# Patient Record
Sex: Male | Born: 1975 | Hispanic: Yes | Marital: Married | Smoking: Never smoker
Health system: Southern US, Community
[De-identification: ages and names within clinical notes are randomized; demographics above are authoritative.]

## PROBLEM LIST (undated history)

## (undated) DIAGNOSIS — I10 Essential (primary) hypertension: Secondary | ICD-10-CM

## (undated) DIAGNOSIS — S6991XA Unspecified injury of right wrist, hand and finger(s), initial encounter: Secondary | ICD-10-CM

## (undated) DIAGNOSIS — F32A Depression, unspecified: Secondary | ICD-10-CM

## (undated) DIAGNOSIS — S4991XA Unspecified injury of right shoulder and upper arm, initial encounter: Secondary | ICD-10-CM

## (undated) DIAGNOSIS — F329 Major depressive disorder, single episode, unspecified: Secondary | ICD-10-CM

## (undated) HISTORY — DX: Unspecified injury of right wrist, hand and finger(s), initial encounter: S69.91XA

## (undated) HISTORY — DX: Unspecified injury of right shoulder and upper arm, initial encounter: S49.91XA

---

## 2012-09-20 ENCOUNTER — Emergency Department (HOSPITAL_BASED_OUTPATIENT_CLINIC_OR_DEPARTMENT_OTHER)
Admission: EM | Admit: 2012-09-20 | Discharge: 2012-09-20 | Disposition: A | Discharge status: Home / Self Care | Payer: BC Managed Care – PPO | Attending: Emergency Medicine | Admitting: Emergency Medicine

## 2012-09-20 ENCOUNTER — Encounter (HOSPITAL_BASED_OUTPATIENT_CLINIC_OR_DEPARTMENT_OTHER): Payer: Self-pay | Admitting: *Deleted

## 2012-09-20 ENCOUNTER — Emergency Department (HOSPITAL_BASED_OUTPATIENT_CLINIC_OR_DEPARTMENT_OTHER): Payer: BC Managed Care – PPO

## 2012-09-20 DIAGNOSIS — M25511 Pain in right shoulder: Secondary | ICD-10-CM

## 2012-09-20 DIAGNOSIS — Y929 Unspecified place or not applicable: Secondary | ICD-10-CM | POA: Insufficient documentation

## 2012-09-20 DIAGNOSIS — S60229A Contusion of unspecified hand, initial encounter: Secondary | ICD-10-CM | POA: Insufficient documentation

## 2012-09-20 DIAGNOSIS — Z8659 Personal history of other mental and behavioral disorders: Secondary | ICD-10-CM | POA: Insufficient documentation

## 2012-09-20 DIAGNOSIS — S60221A Contusion of right hand, initial encounter: Secondary | ICD-10-CM

## 2012-09-20 DIAGNOSIS — Y990 Civilian activity done for income or pay: Secondary | ICD-10-CM | POA: Insufficient documentation

## 2012-09-20 DIAGNOSIS — Y939 Activity, unspecified: Secondary | ICD-10-CM | POA: Insufficient documentation

## 2012-09-20 DIAGNOSIS — S6990XA Unspecified injury of unspecified wrist, hand and finger(s), initial encounter: Secondary | ICD-10-CM | POA: Insufficient documentation

## 2012-09-20 DIAGNOSIS — W010XXA Fall on same level from slipping, tripping and stumbling without subsequent striking against object, initial encounter: Secondary | ICD-10-CM | POA: Insufficient documentation

## 2012-09-20 HISTORY — DX: Major depressive disorder, single episode, unspecified: F32.9

## 2012-09-20 HISTORY — DX: Depression, unspecified: F32.A

## 2012-09-20 MED ORDER — HYDROCODONE-ACETAMINOPHEN 5-325 MG PO TABS: 1.0000 | ORAL_TABLET | ORAL | Status: DC | PRN

## 2012-09-20 NOTE — ED Notes (Signed)
np at bedside

## 2012-09-20 NOTE — ED Provider Notes (Signed)
Medical screening examination/treatment/procedure(s) were performed by non-physician practitioner and as supervising physician I was immediately available for consultation/collaboration.  Geoffery Lyons, MD 09/20/12 [phone]

## 2012-09-20 NOTE — ED Provider Notes (Signed)
History     CSN: [id number]  Arrival date & time 09/20/12  1328   First MD Initiated Contact with Patient 09/20/12 1334      Chief Complaint  Patient presents with  . Hand Injury  . Shoulder Injury    (Consider location/radiation/quality/duration/timing/severity/associated sxs/prior treatment) HPI Comments: Pt states that he slipped and fell yesterday at work and now he is having pain in his right hand and shoulder  Patient is a 37 y.o. male presenting with hand injury and shoulder injury. The history is provided by the patient. A language interpreter was used.  Hand Injury Location:  Hand and shoulder Time since incident:  1 day Injury: yes   Mechanism of injury: fall   Shoulder Injury    Past Medical History  Diagnosis Date  . Depression     History reviewed. No pertinent past surgical history.  History reviewed. No pertinent family history.  History  Substance Use Topics  . Smoking status: Never Smoker   . Smokeless tobacco: Not on file  . Alcohol Use: No      Review of Systems  Constitutional: Negative.   Respiratory: Negative.   Cardiovascular: Negative.     Allergies  Review of patient's allergies indicates no known allergies.  Home Medications  No current outpatient prescriptions on file.  BP 155/104  Pulse 89  Temp(Src) 98.1 F (36.7 C) (Oral)  Resp 16  Ht 5\' 6"  (1.676 m)  SpO2 100%  Physical Exam  Nursing note and vitals reviewed. Constitutional: He is oriented to person, place, and time. He appears well-developed and well-nourished.  HENT:  Head: Normocephalic and atraumatic.  Eyes: Conjunctivae and EOM are normal.  Neck: Normal range of motion. Neck supple.  Cardiovascular: Normal rate and regular rhythm.   Pulmonary/Chest: Effort normal and breath sounds normal.  Musculoskeletal:  Generalized swelling noted to right hand in snuff box area:pt has tenderness in the shoulder without gross deformity  Neurological: He is alert and  oriented to person, place, and time. Coordination normal.  Skin: Skin is warm and dry.  Psychiatric: He has a normal mood and affect.    ED Course  Procedures (including critical care time)  Labs Reviewed - No data to display Dg Shoulder Right  09/20/2012   *RADIOLOGY REPORT*  Clinical Data: Fall at work, right hand and shoulder injury  RIGHT SHOULDER - 2+ VIEW  Comparison: Concurrently obtained radiographs of the hands  Findings: Three views of the right shoulder demonstrate no acute fracture or malalignment.  The humeral head is located with respect to the glenoid.  The acromioclavicular joint appears congruent. Normal bony mineralization.  No lytic or blastic osseous lesion. Visualized thorax is unremarkable.  IMPRESSION: Negative radiographs of the shoulder.   Original Report Authenticated By: Malachy Moan, M.D.   Dg Hand Complete Right  09/20/2012   *RADIOLOGY REPORT*  Clinical Data: Fall at work, right hand and shoulder injury  RIGHT HAND - COMPLETE 3+ VIEW  Comparison: Concurrently obtained radiographs of the right shoulder  Findings: Diffuse soft tissue swelling over the hand without evidence of acute fracture or malalignment.  No retained radiopaque foreign object.  Bony mineralization is within normal limits.  No lytic or blastic osseous lesion.  IMPRESSION: Diffuse soft tissue swelling over the dorsum of the hand without acute osseous injury.   Original Report Authenticated By: Malachy Moan, M.D.     1. Hand contusion, right, initial encounter   2. Shoulder pain, right       MDM  Pt placed in splint for comfort:given vicodin and follow up with Dr. Vivi Barrack, NP 09/20/12 [phone]

## 2012-09-20 NOTE — ED Notes (Addendum)
Pt c/o fall at work  right hand  And shoulder injury x 1 day ago

## 2012-09-22 ENCOUNTER — Emergency Department (HOSPITAL_BASED_OUTPATIENT_CLINIC_OR_DEPARTMENT_OTHER)
Admission: EM | Admit: 2012-09-22 | Discharge: 2012-09-22 | Disposition: A | Discharge status: Home / Self Care | Payer: BC Managed Care – PPO | Attending: Emergency Medicine | Admitting: Emergency Medicine

## 2012-09-22 ENCOUNTER — Encounter (HOSPITAL_BASED_OUTPATIENT_CLINIC_OR_DEPARTMENT_OTHER): Payer: Self-pay | Admitting: *Deleted

## 2012-09-22 DIAGNOSIS — F329 Major depressive disorder, single episode, unspecified: Secondary | ICD-10-CM | POA: Insufficient documentation

## 2012-09-22 DIAGNOSIS — Z87828 Personal history of other (healed) physical injury and trauma: Secondary | ICD-10-CM | POA: Insufficient documentation

## 2012-09-22 DIAGNOSIS — F3289 Other specified depressive episodes: Secondary | ICD-10-CM | POA: Insufficient documentation

## 2012-09-22 DIAGNOSIS — M25539 Pain in unspecified wrist: Secondary | ICD-10-CM | POA: Insufficient documentation

## 2012-09-22 DIAGNOSIS — Z760 Encounter for issue of repeat prescription: Secondary | ICD-10-CM | POA: Insufficient documentation

## 2012-09-22 MED ORDER — TRAMADOL HCL 50 MG PO TABS: 50.0000 mg | ORAL_TABLET | Freq: Four times a day (QID) | ORAL | Status: DC | PRN

## 2012-09-22 NOTE — ED Notes (Signed)
Seen and treated 5/28 for shoulder and arm pain states that he is out of his pain meds still  Having pain

## 2012-09-22 NOTE — ED Provider Notes (Signed)
History     CSN: [id number]  Arrival date & time 09/22/12  1214   First MD Initiated Contact with Patient 09/22/12 1224      Chief Complaint  Patient presents with  . still having pain out of meds     (Consider location/radiation/quality/duration/timing/severity/associated sxs/prior treatment) HPI Comments: 37 year old Hispanic male presents to the emergency department requesting a medication refill of his pain medicine that was given to him on May 28 after sustaining her shoulder and wrist injury to his right arm from a fall at work on May 27. He had x-rays of his shoulder and wrist at that time which did not show any acute bony abnormality. He was discharged with Vicodin, however patient states he took the last one last night, and is continuing to have pain. No new injury or trauma. He was advised to followup with Dr. Pearletha Forge, however his appointment is not until Monday. Wrist pain is not that bad as he has been wearing the splint for comfort, however his shoulder has been aching. Denies radiation, numbness or tingling down his arm.  The history is provided by the patient.    Past Medical History  Diagnosis Date  . Depression     History reviewed. No pertinent past surgical history.  History reviewed. No pertinent family history.  History  Substance Use Topics  . Smoking status: Never Smoker   . Smokeless tobacco: Not on file  . Alcohol Use: No      Review of Systems  Musculoskeletal:       Positive for right shoulder pain.  Skin: Negative for color change.  Neurological: Negative for numbness.  All other systems reviewed and are negative.    Allergies  Review of patient's allergies indicates no known allergies.  Home Medications   Current Outpatient Rx  Name  Route  Sig  Dispense  Refill  . HYDROcodone-acetaminophen (NORCO/VICODIN) 5-325 MG per tablet   Oral   Take 1 tablet by mouth every 4 (four) hours as needed for pain.   10 tablet   0   . traMADol  (ULTRAM) 50 MG tablet   Oral   Take 1 tablet (50 mg total) by mouth every 6 (six) hours as needed for pain.   15 tablet   0     BP 149/100  Pulse 83  Temp(Src) 98 F (36.7 C) (Oral)  Resp 20  SpO2 100%  Physical Exam  Constitutional: He is oriented to person, place, and time. He appears well-developed and well-nourished. No distress.  HENT:  Head: Normocephalic and atraumatic.  Mouth/Throat: Oropharynx is clear and moist.  Eyes: Conjunctivae are normal.  Neck: Normal range of motion. Neck supple.  Cardiovascular: Normal rate, regular rhythm and normal heart sounds.   Pulmonary/Chest: Effort normal and breath sounds normal.  Musculoskeletal: Normal range of motion. He exhibits no edema.  Full ROM of right shoulder. Generalized tenderness to palpation of shoulder girdle. No deformity. No overlying bruising or erythema.  Neurological: He is alert and oriented to person, place, and time. He has normal strength. No sensory deficit.  Strength upper extremity 5/5 and equal bilateral.  Skin: Skin is warm and dry. He is not diaphoretic.  Psychiatric: He has a normal mood and affect. His behavior is normal.    ED Course  Procedures (including critical care time)  Labs Reviewed - No data to display Dg Shoulder Right  09/20/2012   *RADIOLOGY REPORT*  Clinical Data: Fall at work, right hand and shoulder injury  RIGHT  SHOULDER - 2+ VIEW  Comparison: Concurrently obtained radiographs of the hands  Findings: Three views of the right shoulder demonstrate no acute fracture or malalignment.  The humeral head is located with respect to the glenoid.  The acromioclavicular joint appears congruent. Normal bony mineralization.  No lytic or blastic osseous lesion. Visualized thorax is unremarkable.  IMPRESSION: Negative radiographs of the shoulder.   Original Report Authenticated By: Malachy Moan, M.D.   Dg Hand Complete Right  09/20/2012   *RADIOLOGY REPORT*  Clinical Data: Fall at work, right  hand and shoulder injury  RIGHT HAND - COMPLETE 3+ VIEW  Comparison: Concurrently obtained radiographs of the right shoulder  Findings: Diffuse soft tissue swelling over the hand without evidence of acute fracture or malalignment.  No retained radiopaque foreign object.  Bony mineralization is within normal limits.  No lytic or blastic osseous lesion.  IMPRESSION: Diffuse soft tissue swelling over the dorsum of the hand without acute osseous injury.   Original Report Authenticated By: Malachy Moan, M.D.     1. Medication refill       MDM  37 y/o male requesting medication refill on vicodin. No new injury or trauma of shoulder from 5/28. Full ROM, no deformity, generalized tenderness. Neurovascularly intact. Rx for ultram. Explained importance of f/u with Dr. Pearletha Forge as advised prior. Patient states understanding of plan and is agreeable.     Trevor Mace, PA-C 09/22/12 1246

## 2012-09-22 NOTE — ED Provider Notes (Signed)
Medical screening examination/treatment/procedure(s) were performed by non-physician practitioner and as supervising physician I was immediately available for consultation/collaboration.   Riot Barrick, MD 09/22/12 1505 

## 2012-09-25 ENCOUNTER — Encounter: Payer: Self-pay | Admitting: Family Medicine

## 2012-09-25 ENCOUNTER — Ambulatory Visit: Payer: Self-pay | Admitting: Family Medicine

## 2012-09-25 ENCOUNTER — Ambulatory Visit (INDEPENDENT_AMBULATORY_CARE_PROVIDER_SITE_OTHER): Payer: BC Managed Care – PPO | Admitting: Family Medicine

## 2012-09-25 VITALS — BP 164/99 | HR 88 | Ht 72.0 in | Wt 265.0 lb

## 2012-09-25 DIAGNOSIS — S6991XA Unspecified injury of right wrist, hand and finger(s), initial encounter: Secondary | ICD-10-CM

## 2012-09-25 DIAGNOSIS — M25519 Pain in unspecified shoulder: Secondary | ICD-10-CM

## 2012-09-25 DIAGNOSIS — S4991XA Unspecified injury of right shoulder and upper arm, initial encounter: Secondary | ICD-10-CM

## 2012-09-25 DIAGNOSIS — M25511 Pain in right shoulder: Secondary | ICD-10-CM

## 2012-09-25 DIAGNOSIS — S59909A Unspecified injury of unspecified elbow, initial encounter: Secondary | ICD-10-CM

## 2012-09-25 DIAGNOSIS — S4980XA Other specified injuries of shoulder and upper arm, unspecified arm, initial encounter: Secondary | ICD-10-CM

## 2012-09-25 MED ORDER — HYDROCODONE-ACETAMINOPHEN 5-325 MG PO TABS: 1.0000 | ORAL_TABLET | Freq: Four times a day (QID) | ORAL | Status: DC | PRN

## 2012-09-25 NOTE — Patient Instructions (Addendum)
You have a sprain of your right shoulder. [place] hielo para 15 minutos 3-4 veces por dia. Aleve 2 tabletas dos vexes por dia con comida para dolor. tambien toma 1 tableta de Norco cada 6 horas si necesita para dolor - no conducir cuando tomar [employer]. Volver a clinica en 1 1/2 semanas para cita. [place] un aparato para tu muneca toda el dia hasta la cita en 1 1/2 semanas.

## 2012-09-26 ENCOUNTER — Encounter: Payer: Self-pay | Admitting: Family Medicine

## 2012-09-26 DIAGNOSIS — S6991XA Unspecified injury of right wrist, hand and finger(s), initial encounter: Secondary | ICD-10-CM

## 2012-09-26 DIAGNOSIS — S4991XA Unspecified injury of right shoulder and upper arm, initial encounter: Secondary | ICD-10-CM

## 2012-09-26 HISTORY — DX: Unspecified injury of right wrist, hand and finger(s), initial encounter: S69.91XA

## 2012-09-26 HISTORY — DX: Unspecified injury of right shoulder and upper arm, initial encounter: S49.91XA

## 2012-09-26 NOTE — Assessment & Plan Note (Signed)
likely contusion - reviewed radiographs which are negative for fracture.  Could have occult scaphoid fracture however - advised to wear thumb spica brace regularly for full 2 weeks then return - if still tender will repeat radiographs.

## 2012-09-26 NOTE — Assessment & Plan Note (Signed)
2/2 AC sprain - expect 2-4 weeks to completely resolve.  Icing, nsaids, norco as needed for severe pain.  F/u in 2 weeks.

## 2012-09-26 NOTE — Progress Notes (Signed)
Patient ID: Bruce Middleton, male   DOB: 20-Jun-1975, 37 y.o.   MRN: [id number]  PCP: No primary provider on file.  Subjective:   HPI: Patient is a 37 y.o. male here for right shoulder and hand injury.  Patient reports on Wednesday 5/28 while at work he went to get his keys after forgetting them - accidentally tripped over something and fell directly onto outstretched right hand and shoulder. Is right handed. Went to ED and had x-rays of shoulder and hand negative for acute abnormalities. Given a brace but he's not wearing this. Given vicodin initially - took these then tried tramadol but states this made him have diarrhea. No prior issues with right wrist/hand/shoulder.  Past Medical History  Diagnosis Date  . Depression     No current outpatient prescriptions on file prior to visit.   No current facility-administered medications on file prior to visit.    History reviewed. No pertinent past surgical history.  No Known Allergies  History   Social History  . Marital Status: Married    Spouse Name: N/A    Number of Children: N/A  . Years of Education: N/A   Occupational History  . Not on file.   Social History Main Topics  . Smoking status: Never Smoker   . Smokeless tobacco: Not on file  . Alcohol Use: No  . Drug Use: No  . Sexually Active: No   Other Topics Concern  . Not on file   Social History Narrative  . No narrative on file    Family History  Problem Relation Age of Onset  . Sudden death Neg Hx   . Hypertension Neg Hx   . Heart attack Neg Hx   . Hyperlipidemia Neg Hx   . Diabetes Neg Hx     BP 164/99  Pulse 88  Ht 6' (1.829 m)  Wt 265 lb (120.203 kg)  BMI 35.93 kg/m2  Review of Systems: See HPI above.    Objective:  Physical Exam:  Gen: NAD  R shoulder: No swelling, ecchymoses.  No gross deformity. TTP at [facility].  No biceps tendon, other shoulder TTP. FROM with pain on flexion and abduction especially past 90 degrees.  Full  IR/ER. Positive crossover adduction. Positive Hawkins, Neers. Negative Speeds, Yergasons. Strength 5/5 with empty can and resisted internal/external rotation with minimal pain. Negative apprehension. NV intact distally.  R wrist/hand: No gross deformity, swelling, bruising. TTP radial aspect of wrist, radius, snuffbox. No other TTP about wrist or hand. FROM digits and wrist. NVI distally.    Assessment & Plan:  1. Right shoulder injury - 2/2 AC sprain - expect 2-4 weeks to completely resolve.  Icing, nsaids, norco as needed for severe pain.  F/u in 2 weeks.  2. Right wrist injury - likely contusion - reviewed radiographs which are negative for fracture.  Could have occult scaphoid fracture however - advised to wear thumb spica brace regularly for full 2 weeks then return - if still tender will repeat radiographs.

## 2012-10-05 ENCOUNTER — Ambulatory Visit (INDEPENDENT_AMBULATORY_CARE_PROVIDER_SITE_OTHER): Payer: BC Managed Care – PPO | Admitting: Family Medicine

## 2012-10-05 ENCOUNTER — Encounter: Payer: Self-pay | Admitting: Family Medicine

## 2012-10-05 ENCOUNTER — Ambulatory Visit (HOSPITAL_BASED_OUTPATIENT_CLINIC_OR_DEPARTMENT_OTHER)
Admission: RE | Admit: 2012-10-05 | Discharge: 2012-10-05 | Disposition: A | Discharge status: Home / Self Care | Payer: BC Managed Care – PPO | Source: Ambulatory Visit | Attending: Family Medicine | Admitting: Family Medicine

## 2012-10-05 VITALS — BP 163/94 | HR 89 | Ht 72.0 in | Wt 275.1 lb

## 2012-10-05 DIAGNOSIS — Z5189 Encounter for other specified aftercare: Secondary | ICD-10-CM

## 2012-10-05 DIAGNOSIS — M25579 Pain in unspecified ankle and joints of unspecified foot: Secondary | ICD-10-CM

## 2012-10-05 DIAGNOSIS — M25571 Pain in right ankle and joints of right foot: Secondary | ICD-10-CM

## 2012-10-05 DIAGNOSIS — M25511 Pain in right shoulder: Secondary | ICD-10-CM

## 2012-10-05 DIAGNOSIS — S6991XD Unspecified injury of right wrist, hand and finger(s), subsequent encounter: Secondary | ICD-10-CM

## 2012-10-05 DIAGNOSIS — M79671 Pain in right foot: Secondary | ICD-10-CM

## 2012-10-05 DIAGNOSIS — M79609 Pain in unspecified limb: Secondary | ICD-10-CM

## 2012-10-05 DIAGNOSIS — M25519 Pain in unspecified shoulder: Secondary | ICD-10-CM

## 2012-10-05 DIAGNOSIS — S4991XD Unspecified injury of right shoulder and upper arm, subsequent encounter: Secondary | ICD-10-CM

## 2012-10-05 MED ORDER — HYDROCODONE-ACETAMINOPHEN 5-325 MG PO TABS: 1.0000 | ORAL_TABLET | Freq: Four times a day (QID) | ORAL | Status: DC | PRN

## 2012-10-05 NOTE — Patient Instructions (Addendum)
I would expect your right shoulder pain to resolve after 2 more weeks typically though can be up to 4 weeks. Continue icing, taking aleve 2 tabs twice a day with food. Norco only as needed if you have some of this left.  You have an ankle sprain. Ice the ankle as well for 15 minutes at a time, 3-4 times a day Try laceup ankle brace for stability when up and walking around. Start physical therapy for strengthening and balance exercises. Do home exercises on days you don't go to therapy.  Can stop using the wrist brace.  Follow up with me in 4 weeks for reevaluation.

## 2012-10-09 ENCOUNTER — Encounter: Payer: Self-pay | Admitting: Family Medicine

## 2012-10-09 DIAGNOSIS — M25571 Pain in right ankle and joints of right foot: Secondary | ICD-10-CM | POA: Insufficient documentation

## 2012-10-09 NOTE — Assessment & Plan Note (Signed)
2/2 AC sprain - expect 2 more weeks to completely resolve in most cases but can be up to 4 weeks.  Icing, nsaids, norco as needed for severe pain.  F/u in 4 weeks.

## 2012-10-09 NOTE — Assessment & Plan Note (Signed)
2/2 contusion - Radiographs were negative for fracture.  Now not having any tenderness.  Discontinue use of splint.  Will not repeat radiographs as clinically improved.

## 2012-10-09 NOTE — Assessment & Plan Note (Signed)
2/2 sprain.  Radiographs of both negative.  ASO for stability.  Start formal PT and home exercise program.  F/u in 4 weeks.

## 2012-10-09 NOTE — Progress Notes (Signed)
Patient ID: Bruce Middleton, male   DOB: January 01, 1976, 37 y.o.   MRN: [id number]  PCP: No primary provider on file.  Subjective:   HPI: Patient is a 37 y.o. male here for f/u right shoulder and hand injury.  6/2: Patient reports on Wednesday 5/28 while at work he went to get his keys after forgetting them - accidentally tripped over something and fell directly onto outstretched right hand and shoulder. Is right handed. Went to ED and had x-rays of shoulder and hand negative for acute abnormalities. Given a brace but he's not wearing this. Given vicodin initially - took these then tried tramadol but states this made him have diarrhea. No prior issues with right wrist/hand/shoulder.  6/12: Patient reports right wrist pain has resolved - gets a little pain around the thumb but not really bothering him. Left wrist brace at home. Right shoulder still bothers him - superior aspect. Popping with motions. No swelling. Has been out of work. Also now reporting right heel and ankle are bothering him - didn't really notice right after the accident but has been worsening since last visit. Pain is across front of ankle and back of right heel. Did not have any imaging done for this. Has been taking aleve, icing, taking norco.  Past Medical History  Diagnosis Date  . Depression     No current outpatient prescriptions on file prior to visit.   No current facility-administered medications on file prior to visit.    History reviewed. No pertinent past surgical history.  No Known Allergies  History   Social History  . Marital Status: Married    Spouse Name: N/A    Number of Children: N/A  . Years of Education: N/A   Occupational History  . Not on file.   Social History Main Topics  . Smoking status: Never Smoker   . Smokeless tobacco: Not on file  . Alcohol Use: No  . Drug Use: No  . Sexually Active: No   Other Topics Concern  . Not on file   Social History Narrative  . No  narrative on file    Family History  Problem Relation Age of Onset  . Sudden death Neg Hx   . Hypertension Neg Hx   . Heart attack Neg Hx   . Hyperlipidemia Neg Hx   . Diabetes Neg Hx     BP 163/94  Pulse 89  Ht 6' (1.829 m)  Wt 275 lb 2 oz (124.796 kg)  BMI 37.31 kg/m2  Review of Systems: See HPI above.    Objective:  Physical Exam:  Gen: NAD  R shoulder: No swelling, ecchymoses.  No gross deformity. Mild TTP at [facility] Joint.  No biceps tendon, other shoulder TTP. FROM. Positive crossover adduction. Mild positive Hawkins, Neers. Negative Speeds, Yergasons. Strength 5/5 with empty can and resisted internal/external rotation with minimal pain. Negative apprehension. NV intact distally.  R wrist/hand: No gross deformity, swelling, bruising. No longer with TTP over snuffbox, distal radius.  No TTP about wrist/hand. No other TTP about wrist or hand. FROM digits and wrist. NVI distally.  R ankle/foot: No gross deformity, swelling, ecchymoses. FROM with 5/5 strength all directions but reports pain with all motions. TTP posterior calcaneus with mild + calcaneal squeeze.  Pain over ATFL, anterior ankle joint as well.  No malleolar, base 5th, navicular TTP. 1+ ant drawer, neg talar tilt though this is painful. Negative syndesmotic compression. Thompsons test negative. NV intact distally.    Assessment & Plan:  1. Right shoulder injury - 2/2 AC sprain - expect 2 more weeks to completely resolve in most cases but can be up to 4 weeks.  Icing, nsaids, norco as needed for severe pain.  F/u in 4 weeks.   2. Right wrist injury - 2/2 contusion - Radiographs were negative for fracture.  Now not having any tenderness.  Discontinue use of splint.  Will not repeat radiographs as clinically improved.    3. Right heel, ankle pain - 2/2 sprain.  Radiographs of both negative.  ASO for stability.  Start formal PT and home exercise program.  F/u in 4 weeks.

## 2012-10-11 ENCOUNTER — Ambulatory Visit: Payer: BC Managed Care – PPO | Attending: Family Medicine | Admitting: Rehabilitation

## 2012-11-09 ENCOUNTER — Ambulatory Visit (INDEPENDENT_AMBULATORY_CARE_PROVIDER_SITE_OTHER): Payer: BC Managed Care – PPO | Admitting: Family Medicine

## 2012-11-09 ENCOUNTER — Encounter: Payer: Self-pay | Admitting: Family Medicine

## 2012-11-09 VITALS — BP 147/96 | HR 72 | Ht 73.0 in | Wt 265.0 lb

## 2012-11-09 DIAGNOSIS — S4991XD Unspecified injury of right shoulder and upper arm, subsequent encounter: Secondary | ICD-10-CM

## 2012-11-09 DIAGNOSIS — M25511 Pain in right shoulder: Secondary | ICD-10-CM

## 2012-11-09 DIAGNOSIS — Z5189 Encounter for other specified aftercare: Secondary | ICD-10-CM

## 2012-11-09 DIAGNOSIS — M25519 Pain in unspecified shoulder: Secondary | ICD-10-CM

## 2012-11-09 MED ORDER — HYDROCODONE-ACETAMINOPHEN 5-325 MG PO TABS: 1.0000 | ORAL_TABLET | Freq: Four times a day (QID) | ORAL | Status: DC | PRN

## 2012-11-09 MED ORDER — DICLOFENAC SODIUM 75 MG PO TBEC: 75.0000 mg | DELAYED_RELEASE_TABLET | Freq: Two times a day (BID) | ORAL | Status: DC

## 2012-11-09 NOTE — Patient Instructions (Addendum)
Check with your insurance regarding how much your deductible is and what percentage they would cover of the MRI (this is usually around 1600 dollars without insurance) - call me and let me know about this. This is the next step if you're not getting better with the conservative measures. We could add physical therapy for this but, again, your copays would be very expensive with this it sounds. Last refill of vicodin - take as needed but no driving on this. Try voltaren twice a day with food regularly.

## 2012-11-13 ENCOUNTER — Encounter: Payer: Self-pay | Admitting: Family Medicine

## 2012-11-13 NOTE — Assessment & Plan Note (Signed)
Most of his symptoms suggest AC sprain but would not expect continued pain to this point with just this.  Some evidence of rotator cuff strain as well.  Discussed typically at 6 weeks if not improving we go ahead with MRI to further assess.  Other options could include formal physical therapy and possibly subacromial injection trial (favor MRI or PT with prior injury though).  He wants to check with insurance first before pursuing PT or MRI.  Voltaren, norco for pain.  Will not refill pain medication again - patient instructed regarding this.

## 2012-11-13 NOTE — Progress Notes (Signed)
Patient ID: Bruce Middleton, male   DOB: 1975-11-27, 37 y.o.   MRN: [id number]  PCP: No primary provider on file.  Subjective:   HPI: Patient is a 37 y.o. male here for f/u right shoulder and hand injury.  6/2: Patient reports on Wednesday 5/28 while at work he went to get his keys after forgetting them - accidentally tripped over something and fell directly onto outstretched right hand and shoulder. Is right handed. Went to ED and had x-rays of shoulder and hand negative for acute abnormalities. Given a brace but he's not wearing this. Given vicodin initially - took these then tried tramadol but states this made him have diarrhea. No prior issues with right wrist/hand/shoulder.  6/12: Patient reports right wrist pain has resolved - gets a little pain around the thumb but not really bothering him. Left wrist brace at home. Right shoulder still bothers him - superior aspect. Popping with motions. No swelling. Has been out of work. Also now reporting right heel and ankle are bothering him - didn't really notice right after the accident but has been worsening since last visit. Pain is across front of ankle and back of right heel. Did not have any imaging done for this. Has been taking aleve, icing, taking norco.  7/21: Patient returns stating right shoulder is no better. Feels throbbing with weakness as well. Certain movements of shoulder including overhead, behind back cause pain. Using icy hot patches, doing home exercises on own. Feels worse picking items up also.  Past Medical History  Diagnosis Date  . Depression     No current outpatient prescriptions on file prior to visit.   No current facility-administered medications on file prior to visit.    History reviewed. No pertinent past surgical history.  No Known Allergies  History   Social History  . Marital Status: Married    Spouse Name: N/A    Number of Children: N/A  . Years of Education: N/A    Occupational History  . Not on file.   Social History Main Topics  . Smoking status: Never Smoker   . Smokeless tobacco: Not on file  . Alcohol Use: No  . Drug Use: No  . Sexually Active: No   Other Topics Concern  . Not on file   Social History Narrative  . No narrative on file    Family History  Problem Relation Age of Onset  . Sudden death Neg Hx   . Hypertension Neg Hx   . Heart attack Neg Hx   . Hyperlipidemia Neg Hx   . Diabetes Neg Hx     BP 147/96  Pulse 72  Ht 6\' 1"  (1.854 m)  Wt 265 lb (120.203 kg)  BMI 34.97 kg/m2  Review of Systems: See HPI above.    Objective:  Physical Exam:  Gen: NAD  R shoulder: No swelling, ecchymoses.  No gross deformity. Mild TTP at [facility] Joint, anterior shoulder.  No biceps tendon, other shoulder TTP. FROM with painful arc. Positive crossover adduction. Mild positive Hawkins, Neers. Negative Speeds, Yergasons. Strength 5/5 with empty can and resisted internal/external rotation with mild pain. Negative apprehension. NV intact distally.  Assessment & Plan:  1. Right shoulder injury - Most of his symptoms suggest AC sprain but would not expect continued pain to this point with just this.  Some evidence of rotator cuff strain as well.  Discussed typically at 6 weeks if not improving we go ahead with MRI to further assess.  Other options could  include formal physical therapy and possibly subacromial injection trial (favor MRI or PT with prior injury though).  He wants to check with insurance first before pursuing PT or MRI.  Voltaren, norco for pain.  Will not refill pain medication again - patient instructed regarding this.

## 2012-11-17 ENCOUNTER — Telehealth: Payer: Self-pay | Admitting: Family Medicine

## 2012-11-20 NOTE — Telephone Encounter (Signed)
See above

## 2013-10-22 ENCOUNTER — Encounter (HOSPITAL_COMMUNITY): Payer: Self-pay | Admitting: Emergency Medicine

## 2013-10-22 ENCOUNTER — Emergency Department (INDEPENDENT_AMBULATORY_CARE_PROVIDER_SITE_OTHER)
Admission: EM | Admit: 2013-10-22 | Discharge: 2013-10-22 | Disposition: A | Discharge status: Home / Self Care | Payer: No Typology Code available for payment source | Source: Home / Self Care | Attending: Emergency Medicine | Admitting: Emergency Medicine

## 2013-10-22 DIAGNOSIS — R110 Nausea: Secondary | ICD-10-CM

## 2013-10-22 DIAGNOSIS — K047 Periapical abscess without sinus: Secondary | ICD-10-CM

## 2013-10-22 DIAGNOSIS — K044 Acute apical periodontitis of pulpal origin: Secondary | ICD-10-CM

## 2013-10-22 MED ORDER — CLINDAMYCIN HCL 300 MG PO CAPS: 300.0000 mg | ORAL_CAPSULE | Freq: Four times a day (QID) | ORAL | Status: DC

## 2013-10-22 MED ORDER — GI COCKTAIL ~~LOC~~
30.0000 mL | Freq: Once | ORAL | Status: AC
  Administered 2013-10-22: 30 mL via ORAL

## 2013-10-22 MED ORDER — LIDOCAINE HCL (PF) 1 % IJ SOLN
INTRAMUSCULAR | Status: AC
  Filled 2013-10-22: qty 5

## 2013-10-22 MED ORDER — CEFTRIAXONE SODIUM 1 G IJ SOLR
1.0000 g | Freq: Once | INTRAMUSCULAR | Status: AC
  Administered 2013-10-22: 1 g via INTRAMUSCULAR

## 2013-10-22 MED ORDER — HYDROCODONE-ACETAMINOPHEN 5-325 MG PO TABS: ORAL_TABLET | ORAL | Status: DC

## 2013-10-22 MED ORDER — CEFTRIAXONE SODIUM 1 G IJ SOLR
INTRAMUSCULAR | Status: AC
  Filled 2013-10-22: qty 10

## 2013-10-22 MED ORDER — ACETAMINOPHEN 325 MG PO TABS
650.0000 mg | ORAL_TABLET | Freq: Once | ORAL | Status: AC
  Administered 2013-10-22: 650 mg via ORAL

## 2013-10-22 MED ORDER — ONDANSETRON 4 MG PO TBDP
ORAL_TABLET | ORAL | Status: AC
  Filled 2013-10-22: qty 2

## 2013-10-22 MED ORDER — ACETAMINOPHEN 325 MG PO TABS
ORAL_TABLET | ORAL | Status: AC
  Filled 2013-10-22: qty 2

## 2013-10-22 MED ORDER — GI COCKTAIL ~~LOC~~
ORAL | Status: AC
Start: 2013-10-22 — End: 2013-10-22
  Filled 2013-10-22: qty 30

## 2013-10-22 MED ORDER — ONDANSETRON 8 MG PO TBDP: 8.0000 mg | ORAL_TABLET | Freq: Three times a day (TID) | ORAL | Status: DC | PRN

## 2013-10-22 MED ORDER — ONDANSETRON 4 MG PO TBDP
8.0000 mg | ORAL_TABLET | Freq: Once | ORAL | Status: AC
  Administered 2013-10-22: 8 mg via ORAL

## 2013-10-22 MED ORDER — RANITIDINE HCL 150 MG PO TABS: 150.0000 mg | ORAL_TABLET | Freq: Two times a day (BID) | ORAL | Status: DC

## 2013-10-22 NOTE — ED Notes (Signed)
Seen [facility] and given Rx for "bad teeth" , PCN. Presents w n/v/pain face and teeth

## 2013-10-22 NOTE — Discharge Instructions (Signed)
Absceso dental  (Dental Abscess)  Un absceso dental es la acumulacin de lquido infectado (pus) debido a una infeccin bacteriana en la parte interna del diente (pulpa). Generalmente se produce en la punta de la raz del diente.  CAUSAS   Caries dentales graves.  Traumatismo dental que permite el ingreso de bacterias en la pulpa, como en el caso de un diente roto o Dresdenastillado. SNTOMAS   Dolor intenso en el interior y alrededor del diente infectado.  Hinchazn y enrojecimiento alrededor del diente, en la boca o en la cara.  Sensibilidad.  Secrecin de pus.  Mal aliento.  Gusto desagradable en la boca.  Dificultad para tragar.  Dificultad para abrir Government social research officerla boca.  Nuseas.  Vmitos.  Escalofros.  Ganglios hinchados en el cuello. DIAGNSTICO   Deben tomarle una historia clnica y dental.  El examen consistir en punzar el absceso del diente.  Le tomarn radiografas del diente para identificar el absceso. TRATAMIENTO  El objetivo del tratamiento es eliminar la infeccin. Le indicarn antibiticos para evitar que la infeccin se extienda. Para salvar el diente, el dentista podra realizarle un tratamiento de conducto. Si el diente no puede salvarse, habr que sacarlo (extraerlo) y se drenar el absceso.  INSTRUCCIONES PARA EL CUIDADO EN EL HOGAR   Slo tome medicamentos de venta libre o recetados para The PNC Financialcalmar el dolor, la fiebre, o el [place]alestar, segn las indicaciones de su mdico.  Enjuguese la boca con frecuencia (haga buches) con agua y sal ( de cucharadita de sal en 240 ml. de agua tibia) para aliviar el dolor y la inflamacin.  No conduzca mientras toma analgsicos (narcticos).  No aplique calor en la parte externa del rostro.  Regrese para completar el tratamiento con el dentista, segn las indicaciones. SOLICITE ATENCIN MDICA SI:   El dolor no se alivia con los United Parcelmedicamentos.  El dolor empeora en vez de Sheridanmejorar. SOLICITE ATENCIN MDICA DE INMEDIATO SI:    Tiene fiebre o sntomas persistentes durante ms de 2  3 das.  Tiene fiebre y los sntomas [address]empeor[address].  Siente escalofros o un dolor de cabeza muy intenso.  Tiene problemas para respirar o tragar.  Tiene dificultad para abrir Government social research officerla boca.  Observa hinchazn en el cuello o alrededor del ojo. Document Released: 04/12/2005 Document Revised: 10/12/2011 [facility] Patient Information 2015 [place], [place]. This information is not intended to replace advice given to you by your health care provider. Make sure you discuss any questions you have with your health care provider.  Blood pressure over the ideal can put you at higher risk for stroke, heart disease, and kidney failure.  For this reason, it's important to try to get your blood pressure as close as possible to the ideal.  The ideal blood pressure is 120/80.  Blood pressures from 120-139 systolic over 80-89 diastolic are labeled as "prehypertension."  This means you are at higher risk of developing hypertension in the future.  Blood pressures in this range are not treated with medication, but lifestyle changes are recommended to prevent progression to hypertension.  Blood pressures of 140 and above systolic over 90 and above diastolic are classified as hypertension and are treated with medications.  Lifestyle changes which can benefit both prehypertension and hypertension include the following:   Salt and sodium restriction.  Weight loss.  Regular exercise.  Avoidance of tobacco.  Avoidance of excess alcohol.  The "D.A.S.H" diet.   People with hypertension and prehypertension should limit their salt intake to less than 1500 mg daily.  Reading  the nutrition information on the label of many prepared foods can give you an idea of how much sodium you're consuming at each meal.  Remember that the most important number on the nutrition information is the serving size.  It may be smaller than you think.  Try to avoid adding extra salt at the  table.  You may add small amounts of salt while cooking.  Remember that salt is an acquired taste and you may get used to a using a whole lot less salt than you are using now.  Using less salt lets the food's natural flavors come through.  You might want to consider using salt substitutes, potassium chloride, pepper, or blends of herbs and spices to enhance the flavor of your food.  Foods that contain the most salt include: processed meats (like ham, bacon, lunch meat, sausage, hot dogs, and breakfast meat), chips, pretzels, salted nuts, soups, salty snacks, canned foods, junk food, fast food, restaurant food, mustard, pickles, pizza, popcorn, soy sauce, and worcestershire sauce--quite a list!  You might ask, "Is there anything I can eat?"  The answer is, "yes."  Fruits and vegetables are usually low in salt.  Fresh is better than frozen which is better than canned.  If you have canned vegetables, you can cut down on the salt content by rinsing them in tap water 3 times before cooking.     Weight loss is the second thing you can do to lower your blood pressure.  Getting to and maintaining ideal weight will often normalize your blood pressure and allow you to avoid medications, entirely, cut way down on your dosage of medications, or allow to wean off your meds.  (Note, this should only be done under the supervision of your primary care doctor.)  Of course, weight loss takes time and you may need to be on medication in the meantime.  You shoot for a body mass index of 20-25.  When you go to the urgent care or to your primary care doctor, they should calculate your BMI.  If you don't know what it is, ask.  You can calculate your BMI with the following formula:  Weight in pounds x 703/ (height in inches) x (height in inches).  There are many good diets out there: Weight Watchers and the D.A.S.H. Diet are the best, but often, just modifying a few factors can be helpful:  Don't skip meals, don't eat out, and keeping a  food diary.  I do not recommend fad diets or diet pills which often raise blood pressure.    Everyone should get regular exercise, but this is particularly important for people with high blood pressure.  Just about any exercise is good.  The only exercise which may be harmful is lifting extreme heavy weights.  I recommend moderate exercise such as walking for 30 minutes 5 days a week.  Going to the gym for a 50 minute workout 3 times a week is also good.  This amounts to 150 minutes of exercise weekly.   Anyone with high blood pressure should avoid any use of tobacco.  Tobacco use does not elevate blood pressure, but it increases the risk of heart disease and stroke.  If you are interested in quitting, discuss with your doctor how to quit.  If you are not interested in quitting, ask yourself, "What would my life be like in 10 years if I continue to smoke?"  "How will I know when it is time to quit?"  "How  would my life be better if I were to quit."   Excess alcohol intake can raise the blood pressure.  The safe alcohol intake is 2 drinks or less per day for men and 1 drink per day or less for women.   There is a very good diet which I recommend that has been designed for people with blood pressure called the D.A.S.H. Diet (dietary approaches to stop hypertension).  It consists of fruits, vegetables, lean meats, low fat dairy, whole grains, nuts and seeds.  It is very low in salt and sodium.  It has also been found to have other beneficial health effects such as lowering cholesterol and helping lose weight.  It has been developed by the [employer] Pet[employer]ational Institutes of Health and can be downloaded from the internet without any cost. Just do a Programmer, multimediaweb search on "D.A.S.H. Diet." or go the NIH website (GolfingPosters.tnwww.nih.gov).  There are also cookbooks and diet plans that can be gotten from [place] to help you with this diet.

## 2013-10-22 NOTE — ED Provider Notes (Signed)
Chief Complaint   Chief Complaint  Patient presents with  . Nausea    History of Present Illness   Bruce Middleton is a 38 year old male who's had a one-week history of pain in his left upper molar area with swelling of the gingiva. It's tender to chew on that side. He's had some headache and facial pain and facial swelling on the left. This sometimes keeps him awake at night. [facility] He's been taking this but so far isn't getting much better. Sometimes he has trouble sleeping at night because of the pain. Since he started the penicillin he's had some vomiting. He denies any fever, chills, difficulty breathing, or sore throat, or difficulty swallowing. He's had mild epigastric pain. No hematemesis or melena.  Review of Systems   Other than as noted above, the patient denies any of the following symptoms: Systemic:  No fever or chills. ENT:  No headache, ear ache, sore throat, nasal congestion, facial pain, or swelling. Lymphatic:  No adenopathy. Lungs:  No coughing or shortness of breath.  PMFSH   Past medical history, family history, social history, meds, and allergies were reviewed.   Physical Examination     Vital signs:  BP 148/93  Pulse 95  Temp(Src) 98.8 F (37.1 C) (Oral)  Resp 16  SpO2 98% General:  Alert, oriented, in no distress. ENT:  TMs and canals normal.  Nasal mucosa normal. Mouth exam:  His teeth appear intact, however the left, upper first and second molars are tender to palpation. There is minimal swelling of the gingiva. No collection of pus, no swelling of the floor the mouth, the pharynx is clear and airway is patent. Neck:  No swelling or adenopathy. Lungs:  Breath sounds clear and equal bilaterally.  No wheezes, rales or rhonchi. Heart:  Regular rhythm.  No gallops or murmers. Abdomen: Mild epigastric tenderness to palpation, no guarding or rebound. Bowel sounds are normal. Skin:  Clear, warm and dry.    [facility]   He was given Zofran ODT 8 mg sublingually, Norco 5/325 2 by mouth for pain, and Rocephin 1 g IM. He was also given a dose of GI cocktail.  Assessment   The primary encounter diagnosis was Dental infection. A diagnosis of Nausea was also pertinent to this visit.  I feel the nausea is probably due to mild gastritis.  Plan   1.  Meds:  The following meds were prescribed:   Discharge Medication List as of 10/22/2013 10:52 AM    START taking these medications   Details  clindamycin (CLEOCIN) 300 MG capsule Take 1 capsule (300 mg total) by mouth 4 (four) times daily., Starting 10/22/2013, Until Discontinued, Normal    !! HYDROcodone-acetaminophen (NORCO/VICODIN) 5-325 MG per tablet 1 to 2 tabs every 4 to 6 hours as needed for pain., Print    ondansetron (ZOFRAN ODT) 8 MG disintegrating tablet Take 1 tablet (8 mg total) by mouth every 8 (eight) hours as needed for nausea., Starting 10/22/2013, Until Discontinued, Normal    ranitidine (ZANTAC) 150 MG tablet Take 1 tablet (150 mg total) by mouth 2 (two) times daily., Starting 10/22/2013, Until Discontinued, Normal     !! - Potential duplicate medications found. Please discuss with provider.      2.  Patient Education/Counseling:  The patient was given appropriate handouts, self care instructions, and instructed in symptomatic relief. Suggested sleeping with head of bed elevated and hot salt water mouthwash.  Advised he see a dentist as soon as possible. He may stop the penicillin and switched to clindamycin instead.  3.  Follow up:  The patient was told to follow up if no better in 3 to 4 days, if becoming worse in any way, and given some red flag symptoms such as difficulty swallowing or breathing which would prompt immediate return.  Follow up with a dentist as soon as posssible.     Reuben Likesavid C Keller, MD 10/22/13 [phone]

## 2013-11-11 ENCOUNTER — Ambulatory Visit (INDEPENDENT_AMBULATORY_CARE_PROVIDER_SITE_OTHER): Payer: PRIVATE HEALTH INSURANCE | Admitting: Family Medicine

## 2013-11-11 VITALS — BP 148/88 | HR 89 | Temp 98.8°F | Resp 20 | Ht 70.25 in | Wt 273.2 lb

## 2013-11-11 DIAGNOSIS — K089 Disorder of teeth and supporting structures, unspecified: Secondary | ICD-10-CM

## 2013-11-11 DIAGNOSIS — K0889 Other specified disorders of teeth and supporting structures: Secondary | ICD-10-CM

## 2013-11-11 MED ORDER — AMOXICILLIN 875 MG PO TABS: 875.0000 mg | ORAL_TABLET | Freq: Two times a day (BID) | ORAL | Status: DC

## 2013-11-11 MED ORDER — HYDROCODONE-ACETAMINOPHEN 7.5-325 MG PO TABS: 1.0000 | ORAL_TABLET | Freq: Four times a day (QID) | ORAL | Status: DC | PRN

## 2013-11-11 NOTE — Progress Notes (Signed)
This chart was scribed for Elvina Sidle by [employer], Scribe. This patient was seen in room 2 and the patient's care was started at 12:46 AM.  @UMFCLOGO @  Patient ID: Bruce Middleton MRN: [id number], DOB: 1975/08/20, 38 y.o. Date of Encounter: 11/11/2013, 12:55 PM  Primary Physician: No PCP Per Patient  Chief Complaint: Headache  HPI: 38 y.o. year old male with history below presents with intermittent facial pain over the past week. He states that he believes the pain is originating from a molar. He states that he does not have a dentist. He denies any other pain or symptoms. He works in [employer]. He states that he has no medication allergies. He denies any history of smoking.    No past medical history on file.   Home Meds: Prior to Admission medications   Medication Sig Start Date End Date Taking? Authorizing Provider  ibuprofen (ADVIL,MOTRIN) 800 MG tablet Take 800 mg by mouth every 8 (eight) hours as needed.   Yes Historical Provider, MD  penicillin v potassium (VEETID) 500 MG tablet Take 500 mg by mouth 2 (two) times daily.   Yes Historical Provider, MD    Allergies: No Known Allergies  History   Social History  . Marital Status: Married    Spouse Name: N/A    Number of Children: N/A  . Years of Education: N/A   Occupational History  . Not on file.   Social History Main Topics  . Smoking status: Never Smoker   . Smokeless tobacco: Never Used  . Alcohol Use: No  . Drug Use: No  . Sexual Activity: Not on file   Other Topics Concern  . Not on file   Social History Narrative  . No narrative on file     Review of Systems: Constitutional: negative for chills, fever, night sweats, weight changes, or fatigue  HEENT: negative for vision changes, hearing loss, congestion, rhinorrhea, ST, epistaxis, or sinus pressure Cardiovascular: negative for chest pain or palpitations Respiratory: negative for hemoptysis, wheezing, shortness of breath, or cough Abdominal:  negative for abdominal pain, nausea, vomiting, diarrhea, or constipation Dermatological: negative for rash Neurologic: negative for headache, dizziness, or syncope All other systems reviewed and are otherwise negative with the exception to those above and in the HPI.   Physical Exam: Blood pressure 148/88, pulse 89, temperature 98.8 F (37.1 C), temperature source Oral, resp. rate 20, height 5' 10.25" (1.784 m), weight 273 lb 4 oz (123.945 kg), SpO2 99.00%., Body mass index is 38.94 kg/(m^2). General: Well developed, well nourished, in no acute distress. Head: Tenderness over tooth number 15. Normocephalic, atraumatic, eyes without discharge, sclera non-icteric, nares are without discharge. Bilateral auditory canals clear, TM's are without perforation, pearly grey and translucent with reflective cone of light bilaterally. Oral cavity moist, posterior pharynx without exudate, erythema, peritonsillar abscess, or post nasal drip. Neck: Supple. No thyromegaly. Full ROM. No lymphadenopathy. Lungs: Clear bilaterally to auscultation without wheezes, rales, or rhonchi. Breathing is unlabored. Heart: RRR with S1 S2. No murmurs, rubs, or gallops appreciated. Abdomen: Soft, non-tender, non-distended with normoactive bowel sounds. No hepatomegaly. No rebound/guarding. No obvious abdominal masses. Msk:  Strength and tone normal for age. Extremities/Skin: Warm and dry. No clubbing or cyanosis. No edema. No rashes or suspicious lesions. Neuro: Alert and oriented X 3. Moves all extremities spontaneously. Gait is normal. CNII-XII grossly in tact. Psych:  Responds to questions appropriately with a normal affect.     ASSESSMENT AND PLAN:  38 y.o. year old male with Pain,  dental - Plan: amoxicillin (AMOXIL) 875 MG tablet, HYDROcodone-acetaminophen (NORCO) 7.5-325 MG per tablet     Signed, Elvina SidleKurt Shaneka Efaw, MD 11/11/2013 12:55 PM

## 2013-11-11 NOTE — Patient Instructions (Signed)
Bruce Middleton, DDS   Caries dental  (Dental Caries) La caries dental es la ms comn de todas las enfermedades de la boca. Ocurre en todas las edades, pero es ms frecuente en nios y [place]adultos jvenes.  CMO SE DESARROLLA LA CARIES DENTAL  El proceso de caries comienza cuando las bacterias de la boca se combinan con los alimentos, (especialmente azcares y almidones) para Air cabin crewproducir placa. La placa es una sustancia que se adhiere a las superficies duras de los dientes (Engineer, structuralesmalte dental). Las bacterias de la placa producen cidos que atacan el esmalte de los dientes. Estos cidos tambin pueden atacar la superficie de la raz de un diente (cemento) si este est expuesto. Los ataques repetidos disuelven estas superficies y crean huecos en el diente (cavidades). Si no se tratan, los cidos Starbucks Corporationdestruyen las otras capas del diente.  FACTORES DE RIESGO   El consumo frecuente de bebidas azucaradas.   El consumo frecuente de alimentos que contienen azcar y almidn y de aquellos que se quedan fcilmente adheridos a los dientes.   Higiene bucal deficiente.   Sequedad en la boca.   Abuso de sustancias como metanfetaminas.   Arreglos dentales en mal estado o mal hechos.   Trastornos de Psychologist, sport and exercisela alimentacin.   Reflujo gastroesofgico (ERGE).   Ciertos tratamientos de radiacin en la cabeza y el cuello. SNTOMAS  En las etapas tempranas de la caries dental, rara vez hay sntomas. En algunos casos pueden observarse zonas blancas, con aspecto de tiza, Campbell Soupsobre el esmalte u otras capas del diente. En las etapas posteriores, los sntomas incluyen:   Hoyos y Con-wayhuecos en el esmalte.  Dolor en los dientes despus de consumir alimentos o bebidas dulces, calientes o fros.  Dolor alrededor del diente.  Inflamacin alrededor del diente. DIAGNSTICO  La mayora de las veces, la caries dental se detecta durante un control habitual. El diagnstico se realiza despus hacer de una detallada historia mdica y  odontolgica y de la observacin de las superficies de los dientes buscando signos de caries dental. En algunos casos se utilizan instrumentos especiales, como rayos lser, para buscar caries dentales. Podrn tomarle radiografas dentales de modo que puedan buscarse caries que no se observan a simple vista (como entre las zonas de BorgWarnercontacto entre dos dientes).  TRATAMIENTO  Si la caries dental se encuentra en una etapa temprana, podr revertirse con un tratamiento con flor o la aplicacin de un agente remineralizante en el consultorio del dentista. Es necesario un buen cepillado y Highspireel uso del hilo dental para ayudar a estos tratamientos. Si est en etapas ms avanzadas, el tratamiento depender de la ubicacin y la extensin de la destruccin dental:   Si se ha destruido una pequea zona del diente, la zona ser removida y las cavidades se llenarn con un material como una amalgama de oro o plata o un compuesto de resina.   Si se ha destruido una zona grande del diente, la zona destruida ser removida y se Scientific laboratory techniciancolocar una cubierta (corona) sobre la estructura que quede del diente.   Si est afectada la parte central del diente (pulpa), ser necesario realizar un procedimiento llamado tratamiento de conducto antes de llenar la cavidad o colocar una corona.   Si la mayor parte del diente est destruido, ser necesario extirpar Baristael diente (extraerlo). INSTRUCCIONES PARA EL CUIDADO EN EL HOGAR  Podr evitar, detener o revertir las caries dentales en su casa, con una buena higiene bucal. La buena higiene bucal incluye:   Una buena higiene de los dientes  al [employer] veces por da con cepillo e hilo dental.   Use una pasta dental con flor. Tambin puede usar un enjuague dental con flor si se lo recomienda el odontlogo o el mdico.   Limite la cantidad de alimentos y bebidas que contengan azcar y almidones que consume.   Evite el consumo frecuente de estos alimentos y bebidas.   Cumpla con las  visitas a un dentista para realizar controles y limpieza regulares. PREVENCIN   Mantenga una buena higiene bucal.  Considere un sellador dental. Un sellador dental es un revestimiento de material que aplica el dentista a las muescas y [employer]. El sellador impide que los alimentos queden atrapados en los huecos. Puede proteger a los Print production planner.  Pida suplementos con flor si vive en una comunidad cuya agua no tiene flor o con agua que tenga bajo contenido de flor. Use suplementos de flor segn las indicaciones del odontlogo o el mdico.  Permita las aplicaciones de flor en los dientes si se lo indica el odontlogo o el mdico. Document Released: 04/12/2005 Document Revised: 12/13/2012 [facility] Patient Information 2015 [place], [place]. This information is not intended to replace advice given to you by your health care provider. Make sure you discuss any questions you have with your health care provider.

## 2013-12-07 ENCOUNTER — Ambulatory Visit (INDEPENDENT_AMBULATORY_CARE_PROVIDER_SITE_OTHER): Payer: PRIVATE HEALTH INSURANCE | Admitting: Emergency Medicine

## 2013-12-07 ENCOUNTER — Encounter: Payer: Self-pay | Admitting: *Deleted

## 2013-12-07 VITALS — BP 140/90 | HR 79 | Temp 98.3°F | Resp 16 | Ht 72.0 in | Wt 272.0 lb

## 2013-12-07 DIAGNOSIS — S025XXA Fracture of tooth (traumatic), initial encounter for closed fracture: Secondary | ICD-10-CM

## 2013-12-07 DIAGNOSIS — S025XXB Fracture of tooth (traumatic), initial encounter for open fracture: Secondary | ICD-10-CM

## 2013-12-07 DIAGNOSIS — S139XXA Sprain of joints and ligaments of unspecified parts of neck, initial encounter: Secondary | ICD-10-CM

## 2013-12-07 MED ORDER — NAPROXEN SODIUM 550 MG PO TABS: 550.0000 mg | ORAL_TABLET | Freq: Two times a day (BID) | ORAL | Status: AC

## 2013-12-07 MED ORDER — CYCLOBENZAPRINE HCL 10 MG PO TABS
10.0000 mg | ORAL_TABLET | Freq: Three times a day (TID) | ORAL | Status: DC | PRN
Start: 2013-12-07 — End: 2015-01-05

## 2013-12-07 MED ORDER — ACETAMINOPHEN-CODEINE #3 300-30 MG PO TABS: 1.0000 | ORAL_TABLET | ORAL | Status: DC | PRN

## 2013-12-07 MED ORDER — PENICILLIN V POTASSIUM 500 MG PO TABS: 500.0000 mg | ORAL_TABLET | Freq: Four times a day (QID) | ORAL | Status: DC

## 2013-12-07 NOTE — Progress Notes (Signed)
[facility] [address], [place] [id number] [phone]  Date:  12/07/2013   Name:  Bruce Middleton   DOB:  1975-06-18   MRN:  [id number]  PCP:  No PCP Per Patient    Chief Complaint: Arm Pain and Otalgia   History of Present Illness:  Bruce Middleton is a 38 y.o. very pleasant male patient who presents with the following:  Multiple complaints.  Has recurrent dental pain in left maxillary molar.  No swelling or fever or chills. Has pain in right shoulder after lifting a heavy object.  Has pain in shoulder into the arm.  Some weakness. No direct injury Denies other complaint or health concern today.   There are no active problems to display for this patient.   History reviewed. No pertinent past medical history.  History reviewed. No pertinent past surgical history.  History  Substance Use Topics  . Smoking status: Never Smoker   . Smokeless tobacco: Never Used  . Alcohol Use: No    History reviewed. No pertinent family history.  No Known Allergies  Medication list has been reviewed and updated.  Current Outpatient Prescriptions on File Prior to Visit  Medication Sig Dispense Refill  . ibuprofen (ADVIL,MOTRIN) 800 MG tablet Take 800 mg by mouth every 8 (eight) hours as needed.      Marland Kitchen. HYDROcodone-acetaminophen (NORCO) 7.5-325 MG per tablet Take 1 tablet by mouth every 6 (six) hours as needed for moderate pain.  30 tablet  0   No current facility-administered medications on file prior to visit.    Review of Systems:  As per HPI, otherwise negative.    Physical Examination: Filed Vitals:   12/07/13 1118  BP: 140/90  Pulse: 79  Temp: 98.3 F (36.8 C)  Resp: 16   Filed Vitals:   12/07/13 1118  Height: 6' (1.829 m)  Weight: 272 lb (123.378 kg)   Body mass index is 36.88 kg/(m^2). Ideal Body Weight: Weight in (lb) to have BMI = 25: 183.9  GEN: WDWN, NAD, Non-toxic, A & O x 3 HEENT: Atraumatic, Normocephalic. Neck supple. No  masses, No LAD.  Fracture #18 Ears and Nose: No external deformity. NECK:  Tender right trapezius CV: RRR, No M/G/R. No JVD. No thrill. No extra heart sounds. PULM: CTA B, no wheezes, crackles, rhonchi. No retractions. No resp. distress. No accessory muscle use. ABD: S, NT, ND, +BS. No rebound. No HSM. EXTR: No c/c/e NEURO Normal gait.  PSYCH: Normally interactive. Conversant. Not depressed or anxious appearing.  Calm demeanor.    Assessment and Plan: Trapezius strain Anaprox Flexeril Fracture molar Pen vk  tyl #3  Signed,  Phillips OdorJeffery Anderson, MD

## 2013-12-07 NOTE — Patient Instructions (Signed)
Hipertensin (Hypertension) La hipertensin, conocida comnmente como presin arterial alta, se produce cuando la sangre bombea en las arterias con mucha fuerza. Las arterias son los vasos sanguneos que transportan la sangre desde el corazn hacia todas las partes del cuerpo. Una lectura de la presin arterial consiste en un nmero ms alto sobre un nmero ms bajo, por ejemplo, 110/72. El nmero ms alto (presin sistlica) corresponde a la presin interna de las arterias cuando el corazn [place]. El nmero ms bajo (presin diastlica) corresponde a la presin interna de las arterias cuando el corazn se relaja. En condiciones ideales, la presin arterial debe ser inferior a 120/80. La hipertensin fuerza al corazn a trabajar ms para Herbalist. Las arterias pueden estrecharse o ponerse rgidas. La hipertensin conlleva el riesgo de enfermedad cardaca, ictus y otros problemas.  [place] de riesgo de hipertensin son controlables, pero otros no lo son.  [employer] factores de riesgo que usted no puede Chief Technology Officer, se incluyen:   Manufacturing systems engineer. El riesgo es mayor para las Retail banker.  La edad. Los riesgos aumentan con la edad.  El sexo. Antes de los 45aos, los hombres corren ms [employer]. Despus de los 65aos, las mujeres corren ms [employer]. Entre los factores de riesgo que usted puede Chief Technology Officer, se incluyen:  No hacer la cantidad suficiente de actividad fsica o ejercicio.  Tener sobrepeso.  Consumir mucha grasa, azcar, caloras o sal en la dieta.  Beber alcohol en exceso. SIGNOS Y SNTOMAS Por lo general, la hipertensin no causa signos o sntomas. La hipertensin demasiado alta (crisis hipertensiva) puede causar dolor de cabeza, ansiedad, falta de aire y hemorragia nasal. DIAGNSTICO  Para detectar si usted tiene hipertensin, el mdico le medir la presin arterial mientras est sentado, con el brazo  levantado a la altura del corazn. Debe medirla al [facility] veces en el mismo brazo. Determinadas condiciones pueden causar una diferencia de presin arterial entre el brazo izquierdo y Insurance underwriter. El hecho de tener una sola lectura de la presin arterial ms alta que lo normal no significa que Stage manager. En el caso de tener una lectura de la presin arterial con un valor alto, pdale al mdico que la verifique nuevamente. [place] hipertensin arterial incluye hacer cambios en el estilo de vida y, posiblemente, tomar medicamentos. Un estilo de vida saludable puede ayudar a bajar la presin arterial alta. Quiz deba cambiar algunos hbitos. Los Levi Strauss en el estilo de vida pueden incluir:  Seguir la dieta DASH. Esta dieta tiene un alto contenido de frutas, verduras y Psychologist, prison and probation services. Incluye poca cantidad de sal, carnes rojas y azcares agregados.  Hacer al menos 2horas de actividad fsica enrgica todas las semanas.  Perder peso, si es necesario.  No fumar.  Limitar el consumo de bebidas alcohlicas.  Aprender formas de reducir el estrs. Si los cambios en el estilo de vida no son suficientes para Child psychotherapist la presin arterial, el mdico puede recetarle medicamentos. Quiz necesite tomar ms de uno. Trabaje en conjunto con su mdico para comprender los riesgos y los beneficios. INSTRUCCIONES PARA EL CUIDADO EN EL HOGAR  Haga que le midan de nuevo la presin arterial segn las indicaciones del [place] los medicamentos solamente como se lo haya indicado el mdico. Siga cuidadosamente las indicaciones. Los medicamentos para la presin arterial deben tomarse segn las indicaciones. Los medicamentos pierden eficacia al omitir las dosis. El hecho de omitir  las dosis tambin One William Carls Driveaumenta el riesgo de otros [place] Lakeproblemas.  No fume.  Contrlese la presin arterial en su casa segn las indicaciones del mdico. SOLICITE ATENCIN MDICA SI:   Piensa  que tiene una reaccin alrgica a los medicamentos.  Tiene mareos o dolores de cabeza con Naval architectrecurrencia.  Tiene hinchazn en los tobillos.  Tiene problemas de visin. SOLICITE ATENCIN MDICA DE INMEDIATO SI:  Siente un dolor de cabeza intenso o confusin.  Siente debilidad inusual, adormecimiento o que Hospital doctorse desmayar.  Siente dolor intenso en el pecho o en el abdomen.  Vomita repetidas veces.  Tiene dificultad para respirar. ASEGRESE DE QUE:   Comprende estas instrucciones.  Controlar su afeccin.  Recibir ayuda de inmediato si no mejora o si empeora. Document Released: 04/12/2005 Document Revised: 08/27/2013 [facility] Patient Information 2015 [place]ExitCare, [place]LLC. This information is not intended to replace advice given to you by your health care provider. Make sure you discuss any questions you have with your health care provider. Lesiones en los dientes (Tooth Injuries) Las 3 lesiones que con ms frecuencia pueden ocurrir en un diente son 1) fractura 2) luxacin (dislocacin), y 3) avulsin (se pierde el diente completo). Fractura. Una fractura generalmente parte el diente en 2 o ms fragmentos. Parte del diente permanece adherido a la cavidad y Neomia Dearuna o ms piezas del diente se caen.  Cuando la pulpa que se encuentra dentro del diente se daa, puede haber Cabin crewhemorragia en el lugar, o una mancha rosada o roja en la dentina. La dentina es la segunda capa amarilla que generalmente est cubierta por el esmalte. Los problemas relacionados con la dentina causan dolor debido a que la unin del esmalte con la dentina es una zona muy sensible. Para disminuir el dolor, limite la exposicin al aire, a los lquidos de la boca (saliva), a los cambios de Marketing executivetemperatura y evite rozar con la lengua la pulpa del diente.  Las fracturas pueden clasificarse en:  Fracturas de corona. Una fractura de corona implica slo al esmalte, o al esmalte y la dentina. En algunos casos las fracturas de corona se rompen de  modo simple (no involucran la pulpa) o compleja (hay ruptura de la pulpa).  Fractura de la raz. En estos casos casi siempre est involucrada la pulpa.  Una combinacin de [place]ambos. Luxacin. Luxacin significa que el diente se ha dislocado dentro de su cavidad, pero mantiene alguna unin. Hay diferentes tipos de luxaciones. Pueden identificarse porque el diente aparece:  Ms Dan Europelargo que los otros dientes (extrusin).  Est posicionado hacia adelante o hacia atrs de la fila de dientes normales (desplazado lateralmente). En cualquiera de los Cloverdalecasos, el diente debe tomarse firmemente con una mano enguantada y [place]moverse a su posicin normal. Si el procedimiento es muy difcil o doloroso, el diente debe dejarse en el lugar para que el dentista lo reposicione.   Hundido en la enca y se ve ms corto que los dems dientes (intrusin). No intente reposicionar un diente hundido. En cualquier caso de luxacin, debe concurrir a un dentista lo antes posible. Avulsin. Una avulsin elimina el diente completo de su alvolo dental. Para un mejor resultado se requiere Social workercolocar el diente en su lugar dentro de los primeros 60 minutos. Luego de 2 horas, las posibilidades de salvar el diente son pocas pero puede ser beneficioso asistir al dentista lo antes posible. Ubique y proteja el diente perdido. El diente en ocasiones estar en la boca, pero si no puede ubicarlo, verifique la ropa y la zona que lo rodea. Si  est sucio, el diente debe limpiarse suavemente con agua o agua con sal. Para hacer salina, combine  cuchara de t (1,2 mL) de sal de mesa en una taza (0,25 L) de agua tibia. El diente debe manipularse nicamente por su superficie de esmalte. Debe protegerse la raz de otras lesiones. Si el diente no puede reposicionarse en el alvolo dental luego de limpiarlo, transporte el diente en saliva, agua destilada o [employer]. Los dientes de [place] (dientes primarios) no deben reimplantarse. TRATAMIENTO    Morder suavemente una gasa o toalla ayudar a controlar el sangrado. Un nervio expuesto requiere un examen dental y cuidados dentales.  Los fragmentos de dientes deben manipularse por su superficie de esmalte, conservarse, y enviarse al consultorio con la persona lesionada.  Las fracturas menores, que involucran slo el esmalte, normalmente no requieren Health and safety inspector inmediato. Un diente tambin puede aflojarse por una lesin sin fractura o desplazamiento visible. Neomia Dear persona con una lesin de este tipo debe asistir al dentista para Surveyor, quantity, para buscar fracturas debajo de la lnea de la enca.  En las fracturas de raz se debe unir el diente fracturado a uno sano (ferulizacin dental) por un dentista lo antes posible. Si no fuera posible realizar Administrator, Civil Service, puede ser necesaria la extraccin del diente.  Solo tome medicamentos de venta libre o recetados para Chief Technology Officer, el Dentist o la fiebre, segn las indicaciones del profesional.  Si le indican antibiticos, termnelos todos, segn las indicaciones. RIESGOS Y COMPLICACIONES Las complicaciones por lesiones dentales pueden incluir:  Muerte dental.  Prdida del diente.  Deformidad cosmtica.  Infecciones. PREVENCIN Debe Chemical engineer en todos los deportes de contacto. SOLICITE ATENCIN DENTAL SI:  El Metallurgist de mejorar, o si el dolor no es controlable con los medicamentos.  Aumenta la hinchazn o el enrojecimiento en la cara cerca del diente lesionado.  La temperatura oral le sube a ms de 102 F (38.9 C) y no puede bajarla con medicamentos.  No puede abrir Government social research officer. Document Released: 07/20/2007 Document Revised: 07/05/2011 [facility] Patient Information 2015 [place], [place]. This information is not intended to replace advice given to you by your health care provider. Make sure you discuss any questions you have with your health care provider.

## 2013-12-09 ENCOUNTER — Ambulatory Visit (INDEPENDENT_AMBULATORY_CARE_PROVIDER_SITE_OTHER): Payer: PRIVATE HEALTH INSURANCE | Admitting: Physician Assistant

## 2013-12-09 VITALS — BP 168/90 | HR 83 | Temp 98.2°F | Resp 16 | Ht 71.0 in | Wt 272.6 lb

## 2013-12-09 DIAGNOSIS — K0889 Other specified disorders of teeth and supporting structures: Secondary | ICD-10-CM

## 2013-12-09 DIAGNOSIS — K089 Disorder of teeth and supporting structures, unspecified: Secondary | ICD-10-CM

## 2013-12-09 MED ORDER — HYDROCODONE-ACETAMINOPHEN 5-325 MG PO TABS: 1.0000 | ORAL_TABLET | Freq: Four times a day (QID) | ORAL | Status: DC | PRN

## 2013-12-09 NOTE — Progress Notes (Signed)
   Subjective:    Patient ID: Bruce CarrelGustavo Middleton, male    DOB: 08-09-1975, 38 y.o.   MRN: [id number]  HPI Pt presents to clinic for recheck of his tooth pain. He has been taking abx without problems.  He is not getting pain relief with the Naproxen and the Tylenol #3.  He was unable to sleep last night because of the pain.  He does not feel like the tooth has gotten better.  He has a dentist appt on 8/24.  Review of Systems  Constitutional: Negative for fever and chills.  HENT: Positive for dental problem (left lower molar).        Objective:   Physical Exam  Vitals reviewed. Constitutional: He is oriented to person, place, and time. He appears well-developed and well-nourished.  HENT:  Head: Normocephalic and atraumatic.  Right Ear: External ear normal.  Left Ear: External ear normal.  Mouth/Throat:    Eyes: Conjunctivae are normal.  Pulmonary/Chest: Effort normal.  Neurological: He is alert and oriented to person, place, and time.  Skin: Skin is warm and dry.  Psychiatric: He has a normal mood and affect. His behavior is normal. Judgment and thought content normal.       Assessment & Plan:  Pain in a tooth or teeth - Plan: HYDROcodone-acetaminophen (NORCO/VICODIN) 5-325 MG per tablet  Pt to continue NSAID and go to his dentist appt on 8/24.  We will increase his pain medication to see if he has better control of his pain.  He will continue his abx.  Benny LennertSarah Weber PA-C  [facility] 12/09/2013 1:14 PM

## 2013-12-09 NOTE — Patient Instructions (Signed)
Continue taking the antibiotic - Penicillin Continue taking the cyclobenzaprine - for the muscle spasm in your neck Continue the Naproxen for the pain in your neck and tooth pain Add the new pain medication hydrocodone for when your tooth is still hurting. Do not take addition tylenol or motrin with these medications. Eat soft foods.

## 2013-12-26 ENCOUNTER — Emergency Department (HOSPITAL_COMMUNITY)
Admission: EM | Admit: 2013-12-26 | Discharge: 2013-12-26 | Discharge status: Absent without leave | Payer: PRIVATE HEALTH INSURANCE | Source: Home / Self Care

## 2014-01-13 ENCOUNTER — Ambulatory Visit (INDEPENDENT_AMBULATORY_CARE_PROVIDER_SITE_OTHER): Payer: PRIVATE HEALTH INSURANCE | Admitting: Physician Assistant

## 2014-01-13 VITALS — BP 134/92 | HR 72 | Temp 98.3°F | Resp 18 | Ht 71.0 in | Wt 263.0 lb

## 2014-01-13 DIAGNOSIS — K089 Disorder of teeth and supporting structures, unspecified: Secondary | ICD-10-CM

## 2014-01-13 DIAGNOSIS — K0889 Other specified disorders of teeth and supporting structures: Secondary | ICD-10-CM

## 2014-01-13 DIAGNOSIS — J0100 Acute maxillary sinusitis, unspecified: Secondary | ICD-10-CM

## 2014-01-13 MED ORDER — AMOXICILLIN-POT CLAVULANATE 875-125 MG PO TABS: 1.0000 | ORAL_TABLET | Freq: Two times a day (BID) | ORAL | Status: AC

## 2014-01-13 MED ORDER — GUAIFENESIN ER 1200 MG PO TB12: 1.0000 | ORAL_TABLET | Freq: Two times a day (BID) | ORAL | Status: DC | PRN

## 2014-01-13 MED ORDER — IPRATROPIUM BROMIDE 0.03 % NA SOLN: 2.0000 | Freq: Two times a day (BID) | NASAL | Status: DC

## 2014-01-13 NOTE — Patient Instructions (Signed)
Stop the Afrin. Continuing to use it now can worsen your symptoms. Get plenty of rest and drink at least 64 ounces of water daily. See your dentist if the tooth pain and sensitivity continues.

## 2014-01-13 NOTE — Progress Notes (Signed)
Subjective:    Patient ID: Bruce Middleton, male    DOB: 1976-03-28, 38 y.o.   MRN: [id number]   PCP: No PCP Per Patient  Chief Complaint  Patient presents with  . Headache    x 2 days--around the eyes  . Dental Pain    tooth pain for 2 days  . Nasal Congestion     x 2 days--stuffy nose--sore throat--sinus pressure --a little dry cough    Medications, allergies, past medical history, surgical history, family history, social history and problem list reviewed and updated.  HPI  This 38 y.o. male presents for evaluation of 4+ days of illness.  He notes that symptoms were mild initially, but 2 days ago became more severe.  Frontal headache, mostly around his eyes, lots of nasal congestion, Afrin helps x 1-2 hours.  Sore throat, drainage, a small amount of non-productive cough.  No GI/GU symptoms, no unexplained myalgias or arthralgias. No fever/chills.  One tooth on the bottom LEFT is painful with chewing and sensitive to hot and cold food/drink.   Review of Systems As above.    Objective:   Physical Exam  Vitals reviewed. Constitutional: He is oriented to person, place, and time. Vital signs are normal. He appears well-developed and well-nourished. He is active and cooperative. No distress.  BP 134/92  Pulse 72  Temp(Src) 98.3 F (36.8 C) (Oral)  Resp 18  Ht  (1.803 m)  Wt 263 lb (119.296 kg)  BMI 36.70 kg/m2  SpO2 99%   HENT:  Head: Normocephalic and atraumatic.  Right Ear: Hearing, tympanic membrane, external ear and ear canal normal.  Left Ear: Hearing, tympanic membrane, external ear and ear canal normal.  Nose: Mucosal edema and rhinorrhea present.  No foreign bodies. Right sinus exhibits maxillary sinus tenderness and frontal sinus tenderness. Left sinus exhibits maxillary sinus tenderness and frontal sinus tenderness.  Mouth/Throat: Uvula is midline, oropharynx is clear and moist and mucous membranes are normal. No uvula swelling. No oropharyngeal  exudate.    Eyes: Conjunctivae and EOM are normal. Pupils are equal, round, and reactive to light. Right eye exhibits no discharge. Left eye exhibits no discharge. No scleral icterus.  Neck: Trachea normal, normal range of motion and full passive range of motion without pain. Neck supple. No mass and no thyromegaly present.  Cardiovascular: Normal rate, regular rhythm and normal heart sounds.   Pulmonary/Chest: Effort normal and breath sounds normal.  Lymphadenopathy:       Head (right side): No submandibular, no tonsillar, no preauricular, no posterior auricular and no occipital adenopathy present.       Head (left side): No submandibular, no tonsillar, no preauricular and no occipital adenopathy present.    He has no cervical adenopathy.       Right: No supraclavicular adenopathy present.       Left: No supraclavicular adenopathy present.  Neurological: He is alert and oriented to person, place, and time. He has normal strength. No cranial nerve deficit or sensory deficit.  Skin: Skin is warm, dry and intact. No rash noted.  Psychiatric: He has a normal mood and affect. His speech is normal and behavior is normal.          Assessment & Plan:  1. Acute maxillary sinusitis, recurrence not specified Anticipatory guidance. Supportive care. - amoxicillin-clavulanate (AUGMENTIN) 875-125 MG per tablet; Take 1 tablet by mouth 2 (two) times daily.  Dispense: 20 tablet; Refill: 0 - Guaifenesin (MUCINEX MAXIMUM STRENGTH) 1200 MG TB12; Take 1  tablet (1,200 mg total) by mouth every 12 (twelve) hours as needed.  Dispense: 14 tablet; Refill: 1 - ipratropium (ATROVENT) 0.03 % nasal spray; Place 2 sprays into both nostrils 2 (two) times daily.  Dispense: 30 mL; Refill: 0  2. Pain, dental Cover for infection with Augmentin as above.  If pain and sensitivity persist after treatment, follow-up with dentist.   Fernande Bras, PA-C Physician Assistant-Certified [facility]

## 2014-01-14 ENCOUNTER — Telehealth: Payer: Self-pay | Admitting: Physician Assistant

## 2014-01-14 MED ORDER — HYDROCODONE-ACETAMINOPHEN 5-325 MG PO TABS: 1.0000 | ORAL_TABLET | Freq: Four times a day (QID) | ORAL | Status: DC | PRN

## 2014-01-14 NOTE — Telephone Encounter (Signed)
Having lots of dental pain, and HA.  Was seen here yesterday by me. Dx with Sinusitis.  Meds ordered this encounter  Medications  . HYDROcodone-acetaminophen (NORCO) 5-325 MG per tablet    Sig: Take 1 tablet by mouth every 6 (six) hours as needed.    Dispense:  30 tablet    Refill:  0    Order Specific Question:  Supervising Provider    Answer:  DOOLITTLE, ROBERT P [[id number]]

## 2014-01-28 ENCOUNTER — Ambulatory Visit (INDEPENDENT_AMBULATORY_CARE_PROVIDER_SITE_OTHER): Payer: PRIVATE HEALTH INSURANCE

## 2014-01-28 ENCOUNTER — Ambulatory Visit (INDEPENDENT_AMBULATORY_CARE_PROVIDER_SITE_OTHER): Payer: PRIVATE HEALTH INSURANCE | Admitting: Family Medicine

## 2014-01-28 VITALS — BP 142/86 | HR 83 | Temp 97.8°F | Resp 16 | Ht 71.2 in | Wt 259.0 lb

## 2014-01-28 DIAGNOSIS — M7712 Lateral epicondylitis, left elbow: Secondary | ICD-10-CM

## 2014-01-28 DIAGNOSIS — M25522 Pain in left elbow: Secondary | ICD-10-CM

## 2014-01-28 MED ORDER — PREDNISONE 20 MG PO TABS: ORAL_TABLET | ORAL | Status: DC

## 2014-01-28 MED ORDER — IBUPROFEN 800 MG PO TABS: 800.0000 mg | ORAL_TABLET | Freq: Three times a day (TID) | ORAL | Status: DC | PRN

## 2014-01-28 MED ORDER — HYDROCODONE-ACETAMINOPHEN 7.5-325 MG PO TABS: 1.0000 | ORAL_TABLET | Freq: Four times a day (QID) | ORAL | Status: DC | PRN
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
## 2014-01-28 NOTE — Patient Instructions (Addendum)
Continue the ibuprofen 800 mg but take it 3 times daily  Take the prednisone 20 mg 3 pills daily for 2 days, then 2 daily for 2 days, then one daily for 2 days for inflammation  Use the hydrocodone pain pills only when needed for severe pain  Wear a elbow band  Try to minimize use of the left arm when working. If you decide that you are willing to take some time off from work over the next week or to contact us and we will authorize a week off to continue resting the elbow.  Referral is being made to a sports medicine physician to further assess and treat the elbow.  Return here if problems persist  Codo de Bulgariatenista Scientist, product/process development(Tennis Elbow) El profesional que lo asiste le ha diagnosticado una enfermedad denominada "codo de Bulgariatenista". Esto es el resultado de pequeos desgarros o un resentimiento (inflamacin) al comienzo (origen) del msculo extensor del Product managerantebrazo. Aunque la enfermedad a menudo se denomina codo de Bulgariatenista o de Rainiergolfista, est ocasionada por una accin repetida realizada con el codo. INSTRUCCIONES PARA EL CUIDADO DOMICILIARIO  Si la enfermedad recin aparece, el descanso ser el nico tratamiento requerido. Utilizar el otro brazo para Printmakerrealizar la tarea podr ser de Bigelow Cornersutilidad. Incluso cambiar el agarre puede ayudar a descansar la extremidad. Esto tambin podr prevenir que la enfermedad sea recurrente.  Los problemas de [place] Edwardlargo plazo, sin embargo, podrn aliviarse ms rpido mediante:  La utilizacin de Psychiatristagentes antiinflamatorios.  La aplicacin de bolsas con hielo por 30 minutos luego del da laboral, a la hora de dormir, o cuando se han finalizado las Boissevainactividades.  El mdico tambin podr recomendarle una tablilla o cabestrillo. Esto permitir que el tendn inflamado se cure. A veces, se requerirn inyecciones de esteroides junto con anestesia local y un entablillado por 1 a 2 semanas. Dos o tres inyecciones de esteroides a menudo resuelven el problema. En algunos casos de FirstEnergy Corplargo plazo, el  tendn inflamado no responde a la terapia conservadora (no Barbadosquirrgica). Otras veces puede ser necesaria la Azerbaijanciruga. EST SEGURO QUE:   Comprende las instrucciones para el alta mdica.  Controlar su enfermedad.  Solicitar atencin mdica de inmediato segn las indicaciones. Document Released: 04/12/2005 Document Revised: 07/05/2011 [facility] Patient Information 2015 [place]ExitCare, [place]LL[place]. This information is not intended to replace advice given to you by your health care provider. Make sure you discuss any questions you have with your health care provider.

## 2014-01-28 NOTE — Progress Notes (Deleted)
   Subjective:    Patient ID: Bruce Middleton, male    DOB: [birth year], 38 y.o.   MRN: [id number]  HPI    Review of Systems     Objective:   Physical Exam        Assessment & Plan:
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
## 2014-03-15 ENCOUNTER — Ambulatory Visit (INDEPENDENT_AMBULATORY_CARE_PROVIDER_SITE_OTHER): Payer: PRIVATE HEALTH INSURANCE | Admitting: Family Medicine

## 2014-03-15 ENCOUNTER — Encounter: Payer: Self-pay | Admitting: Family Medicine

## 2014-03-15 VITALS — BP 122/74 | HR 82 | Temp 98.2°F | Resp 17 | Ht 70.0 in | Wt 254.0 lb

## 2014-03-15 DIAGNOSIS — T2220XA Burn of second degree of shoulder and upper limb, except wrist and hand, unspecified site, initial encounter: Secondary | ICD-10-CM

## 2014-03-15 DIAGNOSIS — K088 Other specified disorders of teeth and supporting structures: Secondary | ICD-10-CM

## 2014-03-15 DIAGNOSIS — K0889 Other specified disorders of teeth and supporting structures: Secondary | ICD-10-CM

## 2014-03-15 DIAGNOSIS — S40022A Contusion of left upper arm, initial encounter: Secondary | ICD-10-CM

## 2014-03-15 DIAGNOSIS — M7712 Lateral epicondylitis, left elbow: Secondary | ICD-10-CM

## 2014-03-15 MED ORDER — HYDROCODONE-ACETAMINOPHEN 5-325 MG PO TABS: 1.0000 | ORAL_TABLET | Freq: Four times a day (QID) | ORAL | Status: DC | PRN

## 2014-03-15 MED ORDER — PENICILLIN V POTASSIUM 500 MG PO TABS: 500.0000 mg | ORAL_TABLET | Freq: Four times a day (QID) | ORAL | Status: DC
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
## 2014-03-16 ENCOUNTER — Telehealth: Payer: Self-pay | Admitting: Family Medicine

## 2014-03-16 ENCOUNTER — Other Ambulatory Visit: Payer: Self-pay | Admitting: Family Medicine

## 2014-03-16 DIAGNOSIS — K0889 Other specified disorders of teeth and supporting structures: Secondary | ICD-10-CM

## 2014-03-16 MED ORDER — TRAMADOL HCL 50 MG PO TABS: 50.0000 mg | ORAL_TABLET | Freq: Three times a day (TID) | ORAL | Status: DC | PRN

## 2014-03-16 NOTE — Telephone Encounter (Signed)
Patient's daughter called and states that her father is not taking well to the hydrocodone that was prescribed for him. His stomach is burning and he is nauseous. Can he have Tramadol instead?  [phone]

## 2014-03-16 NOTE — Progress Notes (Signed)
Not tolerating his medicine I gave him last night which was hydrocodone. He has tolerated tramadol in the past.l prescribe tramadol.

## 2014-03-16 NOTE — Telephone Encounter (Signed)
This was taken care of already.  

## 2014-03-24 ENCOUNTER — Telehealth: Payer: Self-pay

## 2014-03-24 NOTE — Telephone Encounter (Signed)
Patient called requesting a refill on "Tramadol" . Patient stated he is still having a lot of pain in his arm. He stated he has an appointment with an orthopaedic but its not till December 10th. Patient requesting enough medicine until he can see the Orthopaedic doctor. Patient was given a qty of #15 on 03/16/14 but states his pain is so bad he has had to take 2 to 3 tabs a day. Patients call back number is [phone]. -speaks very little english -

## 2014-03-25 NOTE — Telephone Encounter (Signed)
Call patient back:  Refill for Tramadol 50 mg  Give #30, Take one every 6-8 hours if needed.  Come in at any time if necessary for recheck, but plan to follow with ortho as scheduled.

## 2014-03-25 NOTE — Telephone Encounter (Signed)
Refill called into pharmacy. Pt advised.

## 2014-04-02 ENCOUNTER — Other Ambulatory Visit: Payer: Self-pay

## 2014-04-02 DIAGNOSIS — K0889 Other specified disorders of teeth and supporting structures: Secondary | ICD-10-CM

## 2014-04-02 NOTE — Telephone Encounter (Signed)
Pharm reqs RF of tramadol. Dr Alwyn RenHopper, per prev ph message pt has appt w/ortho 04/04/14. You had Rxd #30 on 11/29 w/sig to take 1 Q 6-8 hrs prn. If pt took 4 a day, he would be out now. Please advise. Pended.

## 2014-04-15 ENCOUNTER — Telehealth: Payer: Self-pay | Admitting: Physician Assistant

## 2014-04-15 ENCOUNTER — Other Ambulatory Visit: Payer: Self-pay | Admitting: Family Medicine

## 2014-04-15 NOTE — Telephone Encounter (Signed)
"  Our records indicate that this patient has received opioid therapy for pain management from at least 4 prescribers and at least 4 pharmacies within the last 6 months."  The document sent includes non-opioid medications as well.  Opioid prescriptions are for 1-13 day supplies of Tramadol, hydrocodone-APAP, APAP-COD #3 and oxycodone-APAP from 10/06/2013-03/25/2014.  [place] Controlled Substance Database reviewed. 04/06/2014 Tramadol 50 mg Dr. Ave Filterhandler, [facility], #60 (20 day supply) 11/07/2013 hydrocodone/APAP 5/325 Dr. Boris LownKiorala, #21 (7 day supply)  Chart reviewed. No PCP identified. Patient seen for a variety of acute illness/injury. Patient seen here: 11/21, 11/20, 10/05, 9/20, 8/16, 8/14, 7/19  While it does not appear that this patient has a opioid dependence, he is seen regularly for dental pain and other acute musculoskeletal injuries.  His next visit should include a more detailed exploration into chronic dental issues and any other chronic issue that may warrant more evaluation, treatment and/or follow-up.

## 2014-04-16 NOTE — Telephone Encounter (Signed)
Patient called requesting a refill on pain medication for tramadol. He was advised to RTC for further evaluation.

## 2014-04-18 NOTE — Telephone Encounter (Signed)
Call: Must be seen and rechecked to discuss need for further pain medicine.  Can take otc tyelenol or ibuprofen for milder pain.

## 2014-04-18 NOTE — Telephone Encounter (Signed)
LM for pt to RTC- unable to refill medication unless pt comes to office.

## 2014-06-21 ENCOUNTER — Emergency Department (INDEPENDENT_AMBULATORY_CARE_PROVIDER_SITE_OTHER)
Admission: EM | Admit: 2014-06-21 | Discharge: 2014-06-21 | Disposition: A | Discharge status: Home / Self Care | Payer: Managed Care, Other (non HMO) | Source: Home / Self Care | Attending: Family Medicine | Admitting: Family Medicine

## 2014-06-21 ENCOUNTER — Encounter (HOSPITAL_COMMUNITY): Payer: Self-pay | Admitting: Emergency Medicine

## 2014-06-21 DIAGNOSIS — K029 Dental caries, unspecified: Secondary | ICD-10-CM

## 2014-06-21 MED ORDER — CLINDAMYCIN HCL 300 MG PO CAPS: 300.0000 mg | ORAL_CAPSULE | Freq: Three times a day (TID) | ORAL | Status: DC

## 2014-06-21 MED ORDER — DICLOFENAC POTASSIUM 50 MG PO TABS: 50.0000 mg | ORAL_TABLET | Freq: Three times a day (TID) | ORAL | Status: DC

## 2014-06-21 NOTE — Discharge Instructions (Signed)
Take medicine as prescribed and see dentist when possible.

## 2014-06-21 NOTE — ED Notes (Signed)
C/o dental pain on left upper side onset 2 weeks Sx also include left side HA and chills Was put on Penicillin 2 weeks ago for dental inf; has appt w/dentist on 07/27/14 to have tooth extracted Alert, no signs of acute distress.

## 2014-06-21 NOTE — ED Provider Notes (Signed)
CSN: [id number]     Arrival date & time 06/21/14  0865 History   First MD Initiated Contact with Patient 06/21/14 0845     Chief Complaint  Patient presents with  . Dental Pain  . Headache   (Consider location/radiation/quality/duration/timing/severity/associated sxs/prior Treatment) Patient is a 39 y.o. male presenting with tooth pain and headaches. The history is provided by the patient.  Dental Pain Location:  Upper Upper teeth location:  15/LU 2nd molar Quality:  Throbbing Severity:  Moderate Progression:  Worsening Chronicity:  New Context: abscess and dental caries   Context comment:  Has seen dentist and put on pcnwith f/u extraction planned. Associated symptoms: headaches   Headache   Past Medical History  Diagnosis Date  . Depression   . Right wrist injury 09/26/2012  . Right shoulder injury 09/26/2012   History reviewed. No pertinent past surgical history. Family History  Problem Relation Age of Onset  . Sudden death Neg Hx   . Hypertension Neg Hx   . Heart attack Neg Hx   . Hyperlipidemia Neg Hx   . Diabetes Neg Hx    History  Substance Use Topics  . Smoking status: Never Smoker   . Smokeless tobacco: Never Used  . Alcohol Use: No    Review of Systems  Constitutional: Negative.   HENT: Positive for dental problem.   Neurological: Positive for headaches.    Allergies  Review of patient's allergies indicates no known allergies.  Home Medications   Prior to Admission medications   Medication Sig Start Date End Date Taking? Authorizing Provider  traMADol (ULTRAM) 50 MG tablet Take 1 tablet (50 mg total) by mouth every 8 (eight) hours as needed. 03/16/14  Yes Peyton Najjar, MD  acetaminophen-codeine (TYLENOL #3) 300-30 MG per tablet Take 1-2 tablets by mouth every 4 (four) hours as needed. 12/07/13   Carmelina Dane, MD  clindamycin (CLEOCIN) 300 MG capsule Take 1 capsule (300 mg total) by mouth 3 (three) times daily. 06/21/14   Linna Hoff, MD    cyclobenzaprine (FLEXERIL) 10 MG tablet Take 1 tablet (10 mg total) by mouth 3 (three) times daily as needed for muscle spasms. 12/07/13   Carmelina Dane, MD  diclofenac (CATAFLAM) 50 MG tablet Take 1 tablet (50 mg total) by mouth 3 (three) times daily. 06/21/14   Linna Hoff, MD  HYDROcodone-acetaminophen (NORCO/VICODIN) 5-325 MG per tablet Take 1 tablet by mouth every 6 (six) hours as needed for moderate pain. 03/15/14   Peyton Najjar, MD  naproxen sodium (ANAPROX DS) 550 MG tablet Take 1 tablet (550 mg total) by mouth 2 (two) times daily with a meal. 12/07/13 12/07/14  Carmelina Dane, MD  penicillin v potassium (VEETID) 500 MG tablet Take 1 tablet (500 mg total) by mouth 4 (four) times daily. 03/15/14   Peyton Najjar, MD   BP 164/83 mmHg  Pulse 83  Temp(Src) 97.9 F (36.6 C) (Oral)  Resp 18  SpO2 99% Physical Exam  Constitutional: He is oriented to person, place, and time. He appears well-developed and well-nourished.  HENT:  Mouth/Throat:    Neck: Normal range of motion. Neck supple.  Lymphadenopathy:    He has no cervical adenopathy.  Neurological: He is alert and oriented to person, place, and time.  Skin: Skin is warm and dry.  Nursing note and vitals reviewed.   ED Course  Procedures (including critical care time) Labs Review Labs Reviewed - No data to display  Imaging Review No  results found.   MDM   1. Pain due to dental caries        Linna HoffJames D Welma Mccombs, MD 06/21/14 [id number]

## 2015-01-05 ENCOUNTER — Ambulatory Visit (INDEPENDENT_AMBULATORY_CARE_PROVIDER_SITE_OTHER): Payer: Managed Care, Other (non HMO) | Admitting: Family Medicine

## 2015-01-05 VITALS — BP 134/87 | HR 80 | Temp 98.0°F | Resp 16 | Ht 69.0 in | Wt 265.0 lb

## 2015-01-05 DIAGNOSIS — K0889 Other specified disorders of teeth and supporting structures: Secondary | ICD-10-CM

## 2015-01-05 DIAGNOSIS — K088 Other specified disorders of teeth and supporting structures: Secondary | ICD-10-CM

## 2015-01-05 MED ORDER — PENICILLIN V POTASSIUM 500 MG PO TABS: 500.0000 mg | ORAL_TABLET | Freq: Three times a day (TID) | ORAL | Status: DC

## 2015-01-05 MED ORDER — HYDROCODONE-ACETAMINOPHEN 5-325 MG PO TABS: 1.0000 | ORAL_TABLET | Freq: Four times a day (QID) | ORAL | Status: DC | PRN

## 2015-01-05 NOTE — Patient Instructions (Addendum)
We are going to treat your tooth infection with penicillin three times a day for 10 days You can also use the pain medication as needed- remember this can make you sleepy Try to keep the area clean with warm water swishes and gentle brushing Let us know if you are not better in next few days  Be sure to see your dentist as soon as you can

## 2015-01-05 NOTE — Progress Notes (Signed)
[facility] [address] [place] [address] [phone]  Date:  01/05/2015   Name:  Bruce Middleton   DOB:  [birth year]   MRN:  [id number]  PCP:  No PCP Per Patient    Chief Complaint: Dental Pain and Headache   History of Present Illness:  Bruce Middleton is a 39 y.o. very pleasant male patient who presents with the following:  He has had left upper molar tooth pain for about one week.  This tooth has not been a problem in the past He has a DDS appt next month.    He is having a hard time eating due to pain No known fever.    He has tried some advil- however this irritates his stomach some  He took some penicillin that he had at home last week  There are no active problems to display for this patient.   Past Medical History  Diagnosis Date  . Depression   . Right wrist injury 09/26/2012  . Right shoulder injury 09/26/2012    History reviewed. No pertinent past surgical history.  Social History  Substance Use Topics  . Smoking status: Never Smoker   . Smokeless tobacco: Never Used  . Alcohol Use: No    Family History  Problem Relation Age of Onset  . Sudden death Neg Hx   . Hypertension Neg Hx   . Heart attack Neg Hx   . Hyperlipidemia Neg Hx   . Diabetes Neg Hx     No Known Allergies  Medication list has been reviewed and updated.  Current Outpatient Prescriptions on File Prior to Visit  Medication Sig Dispense Refill  . penicillin v potassium (VEETID) 500 MG tablet Take 1 tablet (500 mg total) by mouth 4 (four) times daily. 30 tablet 0  . acetaminophen-codeine (TYLENOL #3) 300-30 MG per tablet Take 1-2 tablets by mouth every 4 (four) hours as needed. (Patient not taking: Reported on 01/05/2015) 30 tablet 0  . clindamycin (CLEOCIN) 300 MG capsule Take 1 capsule (300 mg total) by mouth 3 (three) times daily. (Patient not taking: Reported on 01/05/2015) 21 capsule 0  . cyclobenzaprine (FLEXERIL) 10 MG tablet Take 1 tablet  (10 mg total) by mouth 3 (three) times daily as needed for muscle spasms. (Patient not taking: Reported on 01/05/2015) 30 tablet 0  . diclofenac (CATAFLAM) 50 MG tablet Take 1 tablet (50 mg total) by mouth 3 (three) times daily. (Patient not taking: Reported on 01/05/2015) 21 tablet 0  . HYDROcodone-acetaminophen (NORCO/VICODIN) 5-325 MG per tablet Take 1 tablet by mouth every 6 (six) hours as needed for moderate pain. (Patient not taking: Reported on 01/05/2015) 30 tablet 0  . traMADol (ULTRAM) 50 MG tablet Take 1 tablet (50 mg total) by mouth every 8 (eight) hours as needed. (Patient not taking: Reported on 01/05/2015) 15 tablet 0   No current facility-administered medications on file prior to visit.    Review of Systems:  As per HPI- otherwise negative.   Physical Examination: Filed Vitals:   01/05/15 0828  BP: 134/87  Pulse: 80  Temp: 98 F (36.7 C)  Resp: 16   Filed Vitals:   01/05/15 0828  Height:  (1.753 m)  Weight: 265 lb (120.203 kg)   Body mass index is 39.12 kg/(m^2). Ideal Body Weight: Weight in (lb) to have BMI = 25: 168.9  GEN: WDWN, NAD, Non-toxic, A & O x 3, large build, looks well HEENT: Atraumatic, Normocephalic. Neck supple. No  masses, No LAD.  Bilateral TM wnl, oropharynx normal.  PEERL,EOMI.   Ears and Nose: No external deformity. CV: RRR, No M/G/R. No JVD. No thrill. No extra heart sounds. PULM: CTA B, no wheezes, crackles, rhonchi. No retractions. No resp. distress. No accessory muscle use. ABD: S, NT, ND, +BS. No rebound. No HSM. EXTR: No c/c/e NEURO Normal gait.  PSYCH: Normally interactive. Conversant. Not depressed or anxious appearing.  Calm demeanor.  #15 tooth is cracked, has surrounding gum redness, tenderness of the tooth.  No fluctuance or abscess noted   Assessment and Plan: Pain in a tooth or teeth - Plan: penicillin v potassium (VEETID) 500 MG tablet, HYDROcodone-acetaminophen (NORCO/VICODIN) 5-325 MG per tablet  Pain, dental - Plan:  penicillin v potassium (VEETID) 500 MG tablet, HYDROcodone-acetaminophen (NORCO/VICODIN) 5-325 MG per tablet  Treat for a dental infection and pain with pencillin and hydrocodone as needed Follow-up if not improved See DDS soon  Signed Abbe Amsterdam, MD

## 2015-01-07 ENCOUNTER — Other Ambulatory Visit: Payer: Self-pay | Admitting: *Deleted

## 2015-01-07 ENCOUNTER — Telehealth: Payer: Self-pay

## 2015-01-07 MED ORDER — TRAMADOL HCL 50 MG PO TABS: 50.0000 mg | ORAL_TABLET | Freq: Three times a day (TID) | ORAL | Status: DC | PRN

## 2015-01-07 NOTE — Telephone Encounter (Signed)
Rx printed. OK to call or fax to pharmacy as requested.  Meds ordered this encounter  Medications  . traMADol (ULTRAM) 50 MG tablet    Sig: Take 1 tablet (50 mg total) by mouth every 8 (eight) hours as needed.    Dispense:  30 tablet    Refill:  0    Order Specific Question:  Supervising Provider    Answer:  DOOLITTLE, ROBERT P [[id number]]

## 2015-01-07 NOTE — Telephone Encounter (Signed)
HER DAD SAW DR. Patsy Lager ON Sunday FOR MOLAR PAIN. SHE PRESCRIBED HIM HYDROCODONE. IT IS CAUSING HIM TO HAVE NAUSEA AND STOMACH BURNING. HE HAD SOME TRAMADOL FROM AN OLD PRESCRIPTION THAT HE TOOK AND IT SEEMS TO HELP. HE WOULD LIKE TO KNOW IF HE CAN HAVE THAT CALLED INTO HIS PHARMACY. BEST PHONE [phone] (DAUGHTER'S NAME IS MONICA GARCIA)  PHARMACY CHOICE IS [facility] ON [address].  MBC

## 2015-01-07 NOTE — Telephone Encounter (Signed)
Please advise regarding rx.

## 2015-01-07 NOTE — Telephone Encounter (Signed)
Noted. Will fax. Left VM that rx will be at pharmacy.

## 2015-01-17 ENCOUNTER — Other Ambulatory Visit: Payer: Self-pay | Admitting: Physician Assistant

## 2015-01-18 NOTE — Telephone Encounter (Signed)
Spoke with patient communication barrier he is going to have his daughter call back.

## 2015-01-18 NOTE — Telephone Encounter (Signed)
Patient states he is still having severe pain taking 2-3 pills a day.  He has an appointment with his dentist on 10/15.

## 2015-04-26 ENCOUNTER — Ambulatory Visit (INDEPENDENT_AMBULATORY_CARE_PROVIDER_SITE_OTHER): Payer: Managed Care, Other (non HMO) | Admitting: Internal Medicine

## 2015-04-26 VITALS — BP 128/80 | HR 88 | Temp 98.1°F | Resp 16 | Ht 71.0 in | Wt 266.0 lb

## 2015-04-26 DIAGNOSIS — K047 Periapical abscess without sinus: Secondary | ICD-10-CM | POA: Diagnosis not present

## 2015-04-26 MED ORDER — FLUTICASONE PROPIONATE 50 MCG/ACT NA SUSP: NASAL | Status: DC

## 2015-04-26 MED ORDER — OXYCODONE-ACETAMINOPHEN 5-325 MG PO TABS: 1.0000 | ORAL_TABLET | Freq: Four times a day (QID) | ORAL | Status: DC | PRN

## 2015-04-26 MED ORDER — AMOXICILLIN 500 MG PO CAPS: 1000.0000 mg | ORAL_CAPSULE | Freq: Two times a day (BID) | ORAL | Status: DC

## 2015-04-26 NOTE — Progress Notes (Signed)
   Subjective:    Patient ID: Bruce Middleton, male    DOB: [birth year], 39 y.o.   MRN: [id number] By signing my name below, I, Javier Dockerobert Ryan Halas, attest that this documentation has been prepared under the direction and in the presence of Ellamae Siaobert Rose Hegner, MD. Electronically Signed: Javier Dockerobert Ryan Halas, ER Scribe. 04/26/2015. 8:44 AM.  Chief Complaint  Patient presents with  . Sinus Problem     comes and goes   . Oral Pain    x 1 week     HPI HPI Comments: Bruce Middleton is a 39 y.o. male who [facility] complaining of tooth pain and sinus congestion. He states he was chewing some food on christmas and cracked his tooth. He has had worsening pain since that time. In the last few days he has developed sinus congestion and sinus pain.   Past Medical History  Diagnosis Date  . Depression   . Right wrist injury 09/26/2012  . Right shoulder injury 09/26/2012   Allergies  Allergen Reactions  . Hydrocodone Nausea Only    nausea, stomach burning    Review of Systems  Constitutional: Negative for fever and chills.  HENT: Positive for congestion, dental problem and sinus pressure.   Gastrointestinal: Positive for vomiting. Negative for nausea.  Skin: Negative for color change.  Neurological: Negative for facial asymmetry, light-headedness and numbness.       Objective:  BP 128/80 mmHg  Pulse 88  Temp(Src) 98.1 F (36.7 C) (Oral)  Resp 16  Ht 5\' 11"  (1.803 m)  Wt 266 lb (120.657 kg)  BMI 37.12 kg/m2  SpO2 98%  Physical Exam  Constitutional: He is oriented to person, place, and time. He appears well-developed and well-nourished. No distress.  HENT:  Head: Normocephalic and atraumatic.  The last molar on the left is cracked, with an abscess at the base of the tooth that is palpable externally in the malar area  Eyes: Pupils are equal, round, and reactive to light.  Neck: Neck supple.  Cardiovascular: Normal rate.   Pulmonary/Chest: Effort normal. No respiratory  distress.  Musculoskeletal: Normal range of motion.  Neurological: He is alert and oriented to person, place, and time. Coordination normal.  Skin: Skin is warm and dry. He is not diaphoretic.  Psychiatric: He has a normal mood and affect. His behavior is normal.  Nursing note and vitals reviewed.     Assessment & Plan:  I have completed the patient encounter in its entirety as documented by the scribe, with editing by me where necessary. Krysti Hickling P. Merla Richesoolittle, M.D.  Dental abscess  Meds ordered this encounter  Medications  . ibuprofen (ADVIL,MOTRIN) 100 MG/5ML suspension    Sig: Take 200 mg by mouth every 4 (four) hours as needed.  Marland Kitchen. amoxicillin (AMOXIL) 500 MG capsule    Sig: Take 2 capsules (1,000 mg total) by mouth 2 (two) times daily.    Dispense:  40 capsule    Refill:  0  . fluticasone (FLONASE) 50 MCG/ACT nasal spray    Sig: 1 spray each nostril twice a day    Dispense:  16 g    Refill:  6  . oxyCODONE-acetaminophen (ROXICET) 5-325 MG tablet    Sig: Take 1 tablet by mouth every 6 (six) hours as needed for severe pain.    Dispense:  28 tablet    Refill:  0   Fu DDS this week

## 2015-04-28 ENCOUNTER — Telehealth: Payer: Self-pay | Admitting: *Deleted

## 2015-04-28 NOTE — Telephone Encounter (Signed)
The oxycodone may be making his stomach hurt as he has bad side effects of hydrocodone.  There is risk for c diff with clindamycin, so we need to be careful of which abx to use.  I would ask him to continue the amoxicillin.  Take ibuprofen or tylenol for pain, and discontinue the oxycodone and see if it continues to make him nauseous.  Or we can add an anti-emetic.    --also would like to note.  That he has been returning to our clinic for tooth pain for the last 2 years.

## 2015-04-28 NOTE — Telephone Encounter (Signed)
Pt states that he stomach hurts and he vomits after taking the Amoxicillin and nasal spray.  He was seen before for the same thing in Sept.  Can we change the medication?  He had clindamyacin and penicillin the last time  Can call pt back at [phone]

## 2015-04-29 MED ORDER — TRAMADOL HCL 50 MG PO TABS: 50.0000 mg | ORAL_TABLET | Freq: Four times a day (QID) | ORAL | Status: DC | PRN

## 2015-04-29 MED ORDER — CLINDAMYCIN HCL 300 MG PO CAPS: 300.0000 mg | ORAL_CAPSULE | Freq: Three times a day (TID) | ORAL | Status: DC

## 2015-04-29 NOTE — Telephone Encounter (Signed)
Meds ordered this encounter  Medications  . clindamycin (CLEOCIN) 300 MG capsule    Sig: Take 1 capsule (300 mg total) by mouth 3 (three) times daily.    Dispense:  30 capsule    Refill:  0  . traMADol (ULTRAM) 50 MG tablet    Sig: Take 1-2 tablets (50-100 mg total) by mouth every 6 (six) hours as needed.    Dispense:  40 tablet    Refill:  0

## 2015-04-29 NOTE — Telephone Encounter (Signed)
Pt called back again and he states he still needs something for pain. He mentioned Tramadol works best for him and does not hurt his stomach. Can we send in an Rx for this?

## 2015-04-30 ENCOUNTER — Telehealth: Payer: Self-pay | Admitting: *Deleted

## 2015-04-30 NOTE — Telephone Encounter (Signed)
Left message Rx's were sent in.

## 2015-06-03 ENCOUNTER — Other Ambulatory Visit: Payer: Self-pay | Admitting: Internal Medicine

## 2015-06-11 NOTE — Telephone Encounter (Signed)
error 

## 2015-07-13 ENCOUNTER — Ambulatory Visit (INDEPENDENT_AMBULATORY_CARE_PROVIDER_SITE_OTHER): Payer: Managed Care, Other (non HMO) | Admitting: Emergency Medicine

## 2015-07-13 VITALS — BP 170/74 | HR 79 | Temp 98.6°F | Resp 20 | Ht 71.5 in | Wt 271.0 lb

## 2015-07-13 DIAGNOSIS — R112 Nausea with vomiting, unspecified: Secondary | ICD-10-CM | POA: Diagnosis not present

## 2015-07-13 DIAGNOSIS — K047 Periapical abscess without sinus: Secondary | ICD-10-CM | POA: Diagnosis not present

## 2015-07-13 MED ORDER — TRAMADOL HCL 50 MG PO TABS: 50.0000 mg | ORAL_TABLET | Freq: Three times a day (TID) | ORAL | Status: DC | PRN

## 2015-07-13 MED ORDER — AMOXICILLIN 875 MG PO TABS: 875.0000 mg | ORAL_TABLET | Freq: Two times a day (BID) | ORAL | Status: DC

## 2015-07-13 NOTE — Patient Instructions (Addendum)
IF you received an x-ray today, you will receive an invoice from [facility]. Please contact [facility] at [phone] with questions or concerns regarding your invoice.   IF you received labwork today, you will receive an invoice from [employer]. Please contact [facility] at [phone] with questions or concerns regarding your invoice.   Our billing staff will not be able to assist you with questions regarding bills from these companies.  You will be contacted with the lab results as soon as they are available. The fastest way to get your results is to activate your My Chart account. Instructions are located on the last page of this paperwork. If you have not heard from [place] regarding the results in 2 weeks, please contact this office.     Please be sure you have your blood pressure rechecked sometime in the next week.  Absceso dental (Dental Abscess) Un absceso dental es la acumulacin de pus en una pieza dental o alrededor de esta. CAUSAS La causa de la afeccin es una infeccin bacteriana alrededor de la raz de la pieza dental que compromete la parte interior del diente (pulpa dental). Puede presentarse como consecuencia de:  Caries importantes.  Traumatismo en el diente que permite el ingreso de bacterias a la pulpa, como una pieza dental rota o astillada.  Enfermedad grave de las encas alrededor de una pieza dental. SNTOMAS Los sntomas de esta afeccin incluyen lo siguiente:  Dolor intenso en la pieza dental infectada o alrededor de esta.  Hinchazn y enrojecimiento alrededor de la pieza dental infectada, en la boca o en el rostro.  Dolor a Insurance claims handler.  Secrecin de pus.  Mal aliento.  Sabor amargo en la boca.  Dificultad para tragar.  Dificultad para abrir Government social research officer.  Nuseas.  Vmitos.  Escalofros.  Ganglios del cuello inflamados.  Grant Ruts. DIAGNSTICO Esta afeccin se diagnostica al examinar la pieza  dental infectada. Durante el examen, el dentista puede darle golpecitos suaves en la pieza dental infectada. Adems, le har preguntas sobre sus antecedentes dentales y mdicos, y tal vez le indique que se haga radiografas. TRATAMIENTO El tratamiento de la afeccin consiste en eliminar la infeccin. Esto se puede hacer de las siguientes maneras:  Antibiticos.  Un tratamiento de conductos, que puede realizarse para salvar la pieza dental.  La extraccin de la pieza dental que tambin puede incluir el drenaje del absceso. La extraccin se hace si no es posible salvar la pieza dental. INSTRUCCIONES PARA EL CUIDADO EN EL HOGAR  Tome los medicamentos solamente como se lo haya indicado el dentista.  Si le recetaron antibiticos, asegrese de terminarlos, incluso si comienza a Actor.  Enjuguese la boca (haga grgaras) frecuentemente con agua con sal para aliviar el dolor o la hinchazn.  No conduzca ni opere maquinaria pesada mientras toma analgsicos.  No se aplique calor en la parte externa de la boca.  Concurra a todas las visitas de control como se lo haya indicado el dentista. Esto es importante. SOLICITE ATENCIN MDICA SI:  El dolor es ms intenso y no se alivia con los medicamentos. SOLICITE ATENCIN MDICA DE INMEDIATO SI:  Tiene fiebre o siente escalofros.  Los sntomas empeoran repentinamente.  Siente un dolor de cabeza muy intenso.  Tiene problemas para respirar o tragar.  Tiene dificultad para abrir Government social research officer.  Nota hinchazn en el cuello o alrededor del ojo.   Esta informacin no tiene Theme park manager el consejo del mdico. Asegrese de hacerle al mdico  cualquier pregunta que tenga.   Document Released: 04/12/2005 Document Revised: 08/27/2014 Elsevier Interactive Patient Education [employer]2016 [employer].

## 2015-07-13 NOTE — Progress Notes (Signed)
By signing my name below, I, Javier Docker, attest that this documentation has been prepared under the direction and in the presence of Earl Lites, MD. Electronically Signed: Javier Docker, ER Scribe. 07/13/2015. 5:13 PM.  Chief Complaint:  Chief Complaint  Patient presents with  . Dental Pain    x 3 days   HPI: Bruce Middleton is a 40 y.o. male who reports to [facility] today complaining of left sided tooth pain, vomiting, and mild diarrhea for the last two days. Last night he developed a fever. No one is sick at home or work. He is not able to sleep due to his sx. He took  of ibuprofen last night due to his tooth pain. He has a past hx of dental abscess.    Past Medical History  Diagnosis Date  . Depression   . Right wrist injury 09/26/2012  . Right shoulder injury 09/26/2012   No past surgical history on file. Social History   Social History  . Marital Status: Married    Spouse Name: Vernona Rieger  . Number of Children: 2  . Years of Education: 12th grade   Occupational History  . Holiday representative    Social History Main Topics  . Smoking status: Never Smoker   . Smokeless tobacco: Never Used  . Alcohol Use: No  . Drug Use: No  . Sexual Activity: No   Other Topics Concern  . None   Social History Narrative   ** Merged History Encounter **       Lives with his wife and two daughters.   Originally from [place], [place]. Came to the [place] 1999.   Family History  Problem Relation Age of Onset  . Sudden death Neg Hx   . Hypertension Neg Hx   . Heart attack Neg Hx   . Hyperlipidemia Neg Hx   . Diabetes Neg Hx    Allergies  Allergen Reactions  . Hydrocodone Nausea Only    nausea, stomach burning   Prior to Admission medications   Medication Sig Start Date End Date Taking? Authorizing Provider  fluticasone (FLONASE) 50 MCG/ACT nasal spray 1 spray each nostril twice a day 04/26/15  Yes Tonye Pearson, MD  ibuprofen (ADVIL,MOTRIN) 100 MG/5ML suspension Take  200 mg by mouth every 4 (four) hours as needed.   Yes Historical Provider, MD  Oxymetazoline HCl (4-WAY NASAL LONG LASTING SPRAY NA) Place into the nose.   Yes Historical Provider, MD  clindamycin (CLEOCIN) 300 MG capsule Take 1 capsule (300 mg total) by mouth 3 (three) times daily. Patient not taking: Reported on 07/13/2015 04/29/15   Tonye Pearson, MD  traMADol (ULTRAM) 50 MG tablet Take 1-2 tablets (50-100 mg total) by mouth every 6 (six) hours as needed. Patient not taking: Reported on 07/13/2015 04/29/15   Tonye Pearson, MD    ROS: The patient denies fevers, chills, night sweats, unintentional weight loss, chest pain, palpitations, wheezing, dyspnea on exertion, nausea, vomiting, abdominal pain, dysuria, hematuria, melena, numbness, weakness, or tingling.   All other systems have been reviewed and were otherwise negative with the exception of those mentioned in the HPI and as above.    PHYSICAL EXAM: Filed Vitals:   07/13/15 1618  BP: 170/74  Pulse: 79  Temp: 98.6 F (37 C)  Resp: 20   Body mass index is 37.27 kg/(m^2).  BP recheck by Dr. Cleta Alberts 160/80.  General: Alert, no acute distress HEENT:  Normocephalic, atraumatic, oropharynx patent. Dental abscess above his first  premolar.  [facility] Cardiovascular:  Regular rate and rhythm, no rubs murmurs or gallops.  No Carotid bruits, radial pulse intact. No pedal edema.  Respiratory: Clear to auscultation bilaterally.  No wheezes, rales, or rhonchi.  No cyanosis, no use of accessory musculature Abdominal: No organomegaly, abdomen is soft and non-tender, positive bowel sounds.  No masses. Musculoskeletal: Gait intact. No edema, tenderness Skin: No rashes. Neurologic: Facial musculature symmetric. Psychiatric: Patient acts appropriately throughout our interaction. Lymphatic: No cervical or submandibular lymphadenopathy   LABS:   EKG/XRAY:   Primary read [facility]   ASSESSMENT/PLAN: Patient had a dental abscess above the left upper first premolar. His area was opened with a Q-tip. There is a small amount of purulent material. He will be on amoxicillin and Ultram for pain. I advised him to make an appointment as soon as possible with the dentist. His blood pressure was elevated and he was advised to have blood pressure recheck tomorrow from [facility] to be sure it returns to normal.I personally performed the services described in this documentation, which was scribed in my presence. The recorded information has been reviewed and is accurate.   Gross sideeffects, risk and benefits, and alternatives of medications d/w patient. Patient is aware that all medications have potential sideeffects and we are unable to predict every sideeffect or drug-drug interaction that may occur.  Lesle ChrisSteven Daub MD 07/13/2015 5:13 PM

## 2015-07-14 ENCOUNTER — Telehealth: Payer: Self-pay

## 2015-07-14 NOTE — Telephone Encounter (Signed)
Pts daughter stated that her dad said his Benay Pillowremadol is not working. If he could get something different and stronger.  Please advise his daughter Byrd HesselbachMaria, he doesn't speak good english  [phone]

## 2015-07-15 ENCOUNTER — Telehealth: Payer: Self-pay

## 2015-07-15 NOTE — Telephone Encounter (Signed)
Wants a stronger pain medication.  Patient went to dentist, but is out of insurance money for dental.  Call daughter Maxine GlennMonica  [phone]  Stated she called yesterday.  Patient has two last names,

## 2015-07-15 NOTE — Telephone Encounter (Signed)
That is the strongest pain medication we can prescribe. He must see the dentist because he has an abscess in the tooth. Pain medication will not resolve this. The dentist can also prescribe the pain medication he feels is appropriate for the dental problem he has.

## 2015-07-15 NOTE — Telephone Encounter (Signed)
Wrong chart

## 2015-07-15 NOTE — Telephone Encounter (Signed)
Left message for pt to call back  °

## 2015-07-16 NOTE — Telephone Encounter (Signed)
Dr. Cleta Albertsaub states he cannot provide anything stronger for pt. He has to see a dentist and the pain medication will not resolve the problem. Left message for pt to call back.

## 2015-07-20 NOTE — Telephone Encounter (Signed)
Patient's daughter called and states that patient would like a refill for tramadol and the antibiotic. I informed her that the patient might need to be seen again for the antibiotic.

## 2015-07-21 ENCOUNTER — Telehealth: Payer: Self-pay | Admitting: General Practice

## 2015-07-21 NOTE — Telephone Encounter (Signed)
Pt's daughter called to check the status of request   [phone]

## 2015-07-21 NOTE — Telephone Encounter (Signed)
Last encounter accidentally closed... (routed high due to my error)   Roda ShuttersGustavo Cardoza Middleton  07/15/2015  Telephone  MRN:  [id number]   Description: 40 year old male  Provider: Vicenta DunningBarbara A Briggs, RN  [facility]: [facility]       Reason for Call     Advice Only         Call Documentation      Lilia ProKarysha J Reid at 07/21/2015 2:57 PM     Status: Signed       Expand All Collapse All   Pt's daughter called to check the status of request   [phone]            [name] at 07/20/2015 3:34 PM     Status: Signed       Expand All Collapse All   Patient's daughter called and states that patient would like a refill for tramadol and the antibiotic. I informed her that the patient might need to be seen again for the antibiotic.             [name], CMA at 07/16/2015 11:17 AM     Status: Signed       Expand All Collapse All   Dr. Cleta Albertsaub states he cannot provide anything stronger for pt. He has to see a dentist and the pain medication will not resolve the problem. Left message for pt to call back.             [name], CMA at 07/15/2015 5:16 PM     Status: Signed       Expand All Collapse All   Left message for pt to call back.             Collene GobbleSteven A Daub, MD at 07/15/2015 3:36 PM     Status: Signed       Expand All Collapse All   That is the strongest pain medication we can prescribe. He must see the dentist because he has an abscess in the tooth. Pain medication will not resolve this. The dentist can also prescribe the pain medication he feels is appropriate for the dental problem he has.            [name] at 07/15/2015 12:52 PM     Status: Signed       Expand All Collapse All   Wants a stronger pain medication. Patient went to dentist, but is out of insurance money for dental. Call daughter Maxine GlennMonica [phone]  Stated she called yesterday. Patient has two last names,              Encounter MyChart Messages     No messages in this encounter     Routing History     Priority Sent On From To Message Type     07/20/2015 3:35 PM [name] [facility] Rx Refill Pool Patient Calls     07/15/2015 3:38 PM Collene GobbleSteven A Daub, MD [name], CMA Patient Calls     [facility] Clinical Message Pool      07/15/2015 2:13 PM [name], CMA Collene GobbleSteven A Daub, MD Patient Calls     07/15/2015 1:06 PM April Hubert AzureG Barnes [facility] Clinical Message Pool Patient Calls      Created by     April Hubert AzureG Barnes on 07/15/2015 12:52 PM     Visit Pharmacy     [facility] [id number] - [place], [place] - [address] [facility]  OF [address] & [facility] [address]     Contacts       Type Contact Phone    07/15/2015 12:52 PM Phone (Incoming) Bron, [place] (Self) [phone] (H)    07/20/2015 03:34 PM Phone (Incoming) Staley, Lunz [place] (Self) [phone] (H)

## 2015-07-22 NOTE — Telephone Encounter (Signed)
Left message for pt to call back  °

## 2015-07-22 NOTE — Telephone Encounter (Signed)
Pt's daughter states he have an appt with Dr Jairo Benurant. Please call back at [phone]

## 2015-07-22 NOTE — Telephone Encounter (Signed)
His appt is 08/18/2015, I asked the daughter to call me back with information about where he is going.

## 2015-07-23 ENCOUNTER — Other Ambulatory Visit: Payer: Self-pay | Admitting: Emergency Medicine

## 2015-07-23 DIAGNOSIS — K047 Periapical abscess without sinus: Secondary | ICD-10-CM

## 2015-07-23 MED ORDER — AMOXICILLIN 875 MG PO TABS: 875.0000 mg | ORAL_TABLET | Freq: Two times a day (BID) | ORAL | Status: DC

## 2015-07-23 MED ORDER — TRAMADOL HCL 50 MG PO TABS: 50.0000 mg | ORAL_TABLET | Freq: Four times a day (QID) | ORAL | Status: DC | PRN

## 2015-07-23 NOTE — Telephone Encounter (Signed)
I did refill his prescriptions. I cannot write any more prescriptions for antibiotics her pain medicines. He has to see the dentist. I would also like a note from the dentist regarding his treatment.

## 2015-07-23 NOTE — Telephone Encounter (Signed)
See notes under refill enc 3/27.

## 2015-07-23 NOTE — Telephone Encounter (Signed)
Notified daughter on VM that Rxs were faxed to pharm. Also asked Monica to [facility] with contact info for Dr Durant's office as I can not find him on [employer].

## 2015-07-23 NOTE — Telephone Encounter (Signed)
Daughter called and reported that the dentist's name is Dr North Shore at [phone]. I called Dr Deirdre PeeruJeanice Limrham's office and verified that pt's visit for consult is 4/24, and I gave them our fax to send Dr Cleta Albertsaub the notes from visits. Dr Cleta Albertsaub, Lorain ChildesFYI

## 2015-08-04 ENCOUNTER — Other Ambulatory Visit: Payer: Self-pay | Admitting: Emergency Medicine

## 2016-03-20 ENCOUNTER — Ambulatory Visit (INDEPENDENT_AMBULATORY_CARE_PROVIDER_SITE_OTHER): Payer: Managed Care, Other (non HMO) | Admitting: Family Medicine

## 2016-03-20 VITALS — BP 152/98 | HR 84 | Temp 98.3°F | Resp 17 | Ht 70.5 in | Wt 265.0 lb

## 2016-03-20 DIAGNOSIS — S025XXB Fracture of tooth (traumatic), initial encounter for open fracture: Secondary | ICD-10-CM | POA: Diagnosis not present

## 2016-03-20 MED ORDER — PREDNISONE 10 MG PO TABS: ORAL_TABLET | ORAL | 0 refills | Status: DC

## 2016-03-20 MED ORDER — PENICILLIN V POTASSIUM 500 MG PO TABS: 500.0000 mg | ORAL_TABLET | Freq: Four times a day (QID) | ORAL | 0 refills | Status: DC

## 2016-03-20 MED ORDER — OXYCODONE-ACETAMINOPHEN 5-325 MG PO TABS: 1.0000 | ORAL_TABLET | ORAL | 0 refills | Status: DC | PRN

## 2016-03-20 NOTE — Progress Notes (Addendum)
Subjective:  By signing my name below, I, Bruce Middleton, attest that this documentation has been prepared under the direction and in the presence of Bruce SorensonEva Annel Zunker, MD Electronically Signed: Charline BillsEssence Middleton, ED Scribe 03/20/2016 at 11:28 AM.   Patient ID: Bruce Middleton, male    DOB: 12-09-1975, 40 y.o.   MRN: [id number] Chief Complaint  Patient presents with  . Dental Injury    Broke tooth eating ham    HPI HPI Comments: Bruce Middleton is a 40 y.o. male who presents to the [facility] complaining of a dental injury sustained 3 days ago. Pt states that he broke his left upper tooth eating ham 3 days ago. He reports a constant stinging pain that is exacerbated with eating. He has tried rinsing out his mouth and taken previously prescribed penicillin. Pt denies fever and chills. No known drug allergies. Pt contacted his dentist but could be seen until 04/08/16.   No past medical history on file. No current outpatient prescriptions on file prior to visit.   No current facility-administered medications on file prior to visit.    No Known Allergies  Depression screen PHQ 2/9 03/20/2016  Decreased Interest 0  Down, Depressed, Hopeless 0  PHQ - 2 Score 0    Review of Systems  Constitutional: Negative for activity change, appetite change, chills and fever.  HENT: Positive for dental problem. Negative for drooling, ear pain, facial swelling, mouth sores, nosebleeds, sore throat and trouble swallowing.   Skin: Negative for color change.  Psychiatric/Behavioral: Negative for dysphoric mood. The patient is not nervous/anxious.    BP (!) 152/98 (BP Location: Right Arm, Patient Position: Sitting, Cuff Size: Large)   Pulse 84   Temp 98.3 F (36.8 C) (Oral)   Resp 17   Ht 5' 10.5" (1.791 m)   Wt 265 lb (120.2 kg)   SpO2 98%   BMI 37.49 kg/m     Objective:   Physical Exam  Constitutional: He is oriented to person, place, and time. He appears well-developed and  well-nourished. No distress.  HENT:  Head: Normocephalic and atraumatic.  Tooth 13 is cracked in half down midline. erythema on gum around it and posteriorly. All rear molars with caries. Missing tooth 12 and 15.   Eyes: Conjunctivae and EOM are normal.  Neck: Neck supple. No tracheal deviation present.  Cardiovascular: Normal rate.   Pulmonary/Chest: Effort normal. No respiratory distress.  Musculoskeletal: Normal range of motion.  Lymphadenopathy:       Head (right side): Submandibular adenopathy present.       Head (left side): Submandibular (L worse than R) adenopathy present.  Neurological: He is alert and oriented to person, place, and time.  Skin: Skin is warm and dry.  Psychiatric: He has a normal mood and affect. His behavior is normal.  Nursing note and vitals reviewed.     Assessment & Plan:   1. Fracture of tooth (traumatic), initial encounter for open fracture   has dental appt sched for 12/15 - soonest avail bp elev likely due to pain  Meds ordered this encounter  Medications  . acetaminophen (TYLENOL) 325 MG tablet    Sig: Take 650 mg by mouth every 6 (six) hours as needed.  . penicillin v potassium (VEETID) 500 MG tablet    Sig: Take 1 tablet (500 mg total) by mouth 4 (four) times daily.    Dispense:  84 tablet    Refill:  0  . oxyCODONE-acetaminophen (ROXICET) 5-325 MG tablet  Sig: Take 1-2 tablets by mouth every 4 (four) hours as needed for severe pain.    Dispense:  45 tablet    Refill:  0  . predniSONE (DELTASONE) 10 MG tablet    Sig: 6-5-4-3-2-1 tabs po qd    Dispense:  21 tablet    Refill:  0    I personally performed the services described in this documentation, which was scribed in my presence. The recorded information has been reviewed and considered, and addended by me as needed.   Bruce SorensonEva Colter Magowan, M.D.  [facility]  [place] [address] [place], [place] [address] [phone] phone [phone] fax  03/21/16 11:49  PM

## 2016-03-20 NOTE — Patient Instructions (Addendum)
IF you received an x-ray today, you will receive an invoice from [facility]. Please contact [facility] at [phone] with questions or concerns regarding your invoice.   IF you received labwork today, you will receive an invoice from [facility]. Please contact [facility] at [phone] with questions or concerns regarding your invoice.   Our billing staff will not be able to assist you with questions regarding bills from these companies.  You will be contacted with the lab results as soon as they are available. The fastest way to get your results is to activate your My Chart account. Instructions are located on the last page of this paperwork. If you have not heard from us regarding the results in 2 weeks, please contact this office.    Check out: [email or web address]  [facility] Contact Information: [address] [place], [place] - [id number]  [phone]     Lesiones de las piezas dentales (Tooth Injuries) Las lesiones de las piezas dentales (traumatismos dentales) incluyen las fisuras o las roturas dentales, los desplazamientos o las dislocaciones (luxaciones), as como el arrancamiento de las piezas dentales (avulsiones). A menudo, la lesin de una pieza dental se debe tratar de inmediato para salvar el diente. Sin embargo, a veces no es posible salvar una pieza dental despus de una lesin y, por lo Greyclifftanto, tal vez haya que sacarla (extraerla). CAUSAS Las lesiones de las piezas dentales pueden deberse a cualquier fuerza lo bastante poderosa como para Musicianastillar, romper, Public house managerdislocar o arrancar una pieza dental. Las fuerzas pueden deberse a lo siguiente:  Lesiones deportivas.  Cadas.  Accidentes.  Peleas. FACTORES DE RIESGO Puede correr ms riesgo de sufrir una lesin de una pieza dental si practica un deporte de contacto sin usar Production designer, theatre/television/filmun protector bucal. SNTOMAS Una pieza dental que es  empujada dentro de la enca puede verse dislocada o desplazada del alvolo. Una pieza dental rota puede no ser tan evidente. Los sntomas de una lesin de una pieza dental incluyen lo siguiente:  Dolor, especialmente al Becton, Dickinson and Companymasticar.  Una pieza dental floja.  Hemorragia en la pieza dental o alrededor de esta.  Hinchazn o hematomas cerca de la pieza dental.  Hinchazn o hematomas del labio sobre la pieza dental lesionada.  Aumento de la sensibilidad al calor y al fro. DIAGNSTICO Se puede diagnosticar una lesin de una pieza dental con la historia clnica y un examen fsico. Adems, es posible que le hagan radiografas dentales para determinar si hay lesiones en la raz de la pieza dental. TRATAMIENTO El tratamiento depende del tipo y de la gravedad de la lesin. Es posible que el tratamiento deba hacerse rpidamente para salvar la pieza dental. Los tratamientos posibles incluyen lo siguiente:  Reemplazar un fragmento dental por un empaste, una funda o una cobertura protectora dura (corona). Esta puede ser Neomia Dearuna alternativa en el caso de una pieza dental astillada o rota sin compromiso de su parte interior (pulpa dental).  Someterse a un procedimiento para reparar la parte interior de la pieza dental (tratamiento de conductos) y su posterior cobertura con una corona. Este puede realizarse para tratar Neomia Dearuna pieza dental rota con compromiso de la pulpa dental.  Reacomodar la pieza dental dislocada y Investment banker, operationalluego realizar un tratamiento de conductos. Generalmente, el tratamiento de conductos deber realizarse en el trmino de Hartford Financialpocos das despus de la lesin.  Volver a Civil engineer, contractingcolocar una pieza dental arrancada en el alvolo, si es posible; luego, realizar un tratamiento de conductos pocas semanas despus.  Extraccin  de la pieza dental cuya rotura se extiende por debajo de la lnea de la enca o divide totalmente la pieza dental. INSTRUCCIONES PARA EL CUIDADO EN EL HOGAR  Tome los medicamentos solamente como se  lo hayan indicado el odontlogo o el mdico.  Oceanographer a todas las visitas de control como se lo hayan indicado el odontlogo o el mdico. Esto es importante.  No coma ni mastique objetos muy duros. Estos incluyen cubos de hielo, bolgrafos, lpices, caramelos duros y granos de palomitas de maz.  No apriete ni rechine los dientes. Informe al odontlogo o al mdico si rechina los [address] duerme.  Aplquese hielo en la boca cerca de la pieza dental lesionada como se lo hayan indicado el odontlogo o el dentista.  Siga las indicaciones del odontlogo o del mdico respecto de los enjuagues bucales con agua salada.  No abra paquetes con los dientes.  Use siempre un protector bucal cuando practique deportes de contacto. SOLICITE ATENCIN MDICA SI:  Le siguen doliendo los dientes despus de una lesin de una pieza dental.  La pieza dental es sensible al calor y al fro.  Tiene hinchazn cerca de la pieza dental lesionada.  Tiene fiebre.  No puede abrir [employer].  Tiene babeo que est empeorando. Esta informacin no tiene Theme park manager el consejo del mdico. Asegrese de hacerle al mdico cualquier pregunta que tenga. Document Released: 07/20/2007 Document Revised: 08/04/2015 Document Reviewed: 04/08/2014 Elsevier Interactive Patient Education  2017 Elsevier Inc. Plan de alimentacin blanda (Soft-Food Meal Plan) Un plan de alimentacin blanda incluye alimentos seguros y fciles de tragar. Este plan de alimentacin suele usarse en los siguientes casos:  Si tiene problemas para Product manager o tragar alimentos.  Como transicin despus de haber tenido un plan de alimentacin lquida durante un perodo prolongado. QU DEBO SABER ACERCA DEL PLAN DE ALIMENTACIN BLANDA? Un plan de alimentacin blanda incluye alimentos tiernos que son seguros y fciles de Product manager y Engineer, manufacturing. En la [employer] Business Machines, es ms fcil tragar los alimentos en bocados. Un bocado mide  aproximadamente media pulgada o menos. Los alimentos de Goodrich Corporation plan no necesitan molerse ni Psychologist, prison and probation services. Deben evitarse los alimentos muy duros, crujientes o pegajosos. Adems, tambin deben evitarse los panes, cereales, yogures y postres con nueces, semillas o frutas. QU ALIMENTOS PUEDO COMER? Cereales  Arroz blanco y arroz integral. Pan humedecido, aderezos, pastas y fideos. Cereales cocidos o secos bien humedecidos, como la smola (trigo cocido), avena o polenta. Los bizcochos, panes, magdalenas, panqueques y waffles deben estar bien humedecidos. Vegetales  [place] cortada Murphy Oil. Vegetales tiernos cocidos, incluida la papa sin piel. Jugos de vegetales. Caldos o sopas cremosas preparadas con vegetales que no sean fibrosos o gomosos. Tomates colados (sin semillas). Frutas  Frutas enlatadas o bien cocidas. Frutas frescas blandas (maduras) y peladas, como duraznos, pelones, kiwi, meln cantalupo, meln roco de miel y sanda (sin semillas). Frutos secos blandos con semillas pequeas, como las frutillas. Jugos de frutas (sin pulpa). Carnes y [place] fuentes de protenas  Carne [place] tierna y [place]. Carnero. Cordero. Ternera. Pollo. Pavo. Hgado. Jamn. Pescado sin espinas. Huevos. Lubrizol Corporation, bebidas a base de [place] y crema. Queso crema y requesn. Yogur natural. Dulces/postres  Gelatina saborizada. Natillas. Helado natural, yogur helado, sorbetes, batidos y [place]. Bizcochuelos y Programmer, systems. Caramelos duros comunes. Otros  [place], margarina (sin grasas trans) y aceites para cocinar. Mayonesa. Salsas a base de crema. Condimentos no picantes, sal y azcar. Wynetta Emery, melazas, miel y [place]. Esta no es [place]  lista completa de los alimentos o las bebidas recomendados. Consulte a su nutricionista para conocer ms opciones.  QU ALIMENTOS NO ESTN RECOMENDADOS? Cereales  Pan seco, tostadas, galletas de agua sin humedecer. Cereales gruesos o secos, como salvado, granola y trigo triturado.  Panes duros o gomosos con corteza como pan francs o baguettes. Vegetales  Maz. Vegetales crudos, excepto la lechuga cortada Murphy Oilen juliana. Vegetales cocidos fibrosos o duros. Papas duras, fritas, crocantes o con piel. Nils PyleFrutas  Frutas frescas con piel o semillas o ambas, como manzanas, peras o ciruelas. Nils PyleFrutas fibrosas y con mucha pulpa, como la papaya, la pia, el coco o el mango. Frutas deshidratadas, rollitos sabor a fruta y Ingram Micro Inctodos los frutos secos. Carnes y otras fuentes de protenas  Salchichas y perros calientes. Carnes con cartlago. Pescado con espinas. Nueces, semillas y Remsenmantequilla de man o nuez espesa. Dulces/postres  Bizcochuelos o Teaching laboratory techniciangalletitas muy secos o gomosos. Esta no es Raytheonuna lista completa de los alimentos y las bebidas que Personnel officerdebe evitar. Consulte a su nutricionista para obtener ms informacin.  Esta informacin no tiene Theme park managercomo fin reemplazar el consejo del mdico. Asegrese de hacerle al mdico cualquier pregunta que tenga. Document Released: 03/25/2008 Document Revised: 04/17/2013 Document Reviewed: 03/09/2013 Elsevier Interactive Patient Education  2017 ArvinMeritorElsevier Inc.

## 2016-07-29 ENCOUNTER — Ambulatory Visit (INDEPENDENT_AMBULATORY_CARE_PROVIDER_SITE_OTHER): Payer: Managed Care, Other (non HMO) | Admitting: Emergency Medicine

## 2016-07-29 VITALS — BP 163/90 | HR 70 | Temp 98.3°F | Resp 16 | Ht 70.5 in | Wt 267.0 lb

## 2016-07-29 DIAGNOSIS — S025XXA Fracture of tooth (traumatic), initial encounter for closed fracture: Secondary | ICD-10-CM | POA: Diagnosis not present

## 2016-07-29 DIAGNOSIS — K0889 Other specified disorders of teeth and supporting structures: Secondary | ICD-10-CM | POA: Diagnosis not present

## 2016-07-29 MED ORDER — TRAMADOL HCL 50 MG PO TABS: 50.0000 mg | ORAL_TABLET | Freq: Three times a day (TID) | ORAL | 0 refills | Status: DC | PRN

## 2016-07-29 MED ORDER — OXYCODONE-ACETAMINOPHEN 5-325 MG PO TABS: 1.0000 | ORAL_TABLET | Freq: Three times a day (TID) | ORAL | 0 refills | Status: AC | PRN

## 2016-07-29 NOTE — Patient Instructions (Addendum)
     IF you received an x-ray today, you will receive an invoice from [facility]. Please contact [facility] at [phone] with questions or concerns regarding your invoice.   IF you received labwork today, you will receive an invoice from [place]. Please contact [facility] at [phone] with questions or concerns regarding your invoice.   Our billing staff will not be able to assist you with questions regarding bills from these companies.  You will be contacted with the lab results as soon as they are available. The fastest way to get your results is to activate your My Chart account. Instructions are located on the last page of this paperwork. If you have not heard from [place] regarding the results in 2 weeks, please contact this office.     Dolor dental (Dental Pain) Las causas del dolor dental pueden ser muchas, entre ellas, las siguientes:  Caries. La caries hace que el nervio del diente est expuesto al aire y a las temperaturas calientes o fras. Esto puede causar dolor o molestias.  Infeccin o absceso. Un absceso dental es un rea que est llena de pus infectado debido a una infeccin bacteriana en la parte interna del diente (pulpa). Por lo general, se produce en el extremo de la raz del diente.  Lesiones.  Una razn desconocida (idioptica). El dolor puede ser leve o intenso. Quizs solo aparezca en las siguientes ocasiones:  Al masticar.  Al estar expuesto a una temperatura caliente o fra.  Al comer alimentos o tomar bebidas con azcar, por ejemplo, los siguientes:  Gaseosa.  Caramelos. El dolor tambin puede ser [place]. CUIDADOS EN EL HOGAR Controle el dolor dental a fin de Public house manager cambio. Para aliviar las [place], haga lo siguiente:  Tome los medicamentos solamente como se lo haya indicado el dentista.  Si el dentista le indic que tome antibiticos, debe terminarlos, incluso si comienza a Actor.  Concurra a todas las  visitas de control como se lo haya indicado el dentista. Esto es importante.  No se aplique calor en la parte externa de la cara.  Si el dentista se lo ha indicado, enjuguese la boca o haga grgaras con agua salada. Esto ayuda a Engineer, materials y Building services engineer.  Puede preparar agua salada agregando  de cucharadita de sal en 1taza de agua templada.  Aplquese hielo sobre la zona dolorida de la cara:  Ponga el hielo en una bolsa plstica.  Coloque una toalla entre la piel y la bolsa de hielo.  Coloque el hielo durante , 2 a 3veces por Futures trader.  Evite los alimentos o las bebidas que le causen [place], como los siguientes:  Crescent o bebidas muy calientes o muy fros.  Alimentos o bebidas dulces o con azcar. SOLICITE AYUDA SI:  El dolor no se alivia con los [employer].  Los sntomas empeoran.  Aparecen nuevos sntomas. SOLICITE AYUDA DE INMEDIATO SI:  No puede abrir la boca.  Tiene problemas para respirar o tragar.  Tiene fiebre.  Tiene hinchazn en la cara, el cuello o la mandbula. Esta informacin no tiene Theme park manager el consejo del mdico. Asegrese de hacerle al mdico cualquier pregunta que tenga. Document Released: 07/09/2008 Document Revised: 08/27/2014 Document Reviewed: 04/08/2014 Elsevier Interactive Patient Education  2017 ArvinMeritor.

## 2016-07-29 NOTE — Progress Notes (Signed)
Bruce Middleton 41 y.o.   Chief Complaint  Patient presents with  . Dental Pain    HISTORY OF PRESENT ILLNESS: This is a 41 y.o. male complaining of pain to left upper molar since biting down hard 2 days ago. States he's going back to [place] next month and have it fixed there because it's a lot cheaper.  HPI   Prior to Admission medications   Medication Sig Start Date End Date Taking? Authorizing Provider  acetaminophen (TYLENOL) 325 MG tablet Take 650 mg by mouth every 6 (six) hours as needed.   Yes Historical Provider, MD  oxyCODONE-acetaminophen (ROXICET) 5-325 MG tablet Take 1-2 tablets by mouth every 4 (four) hours as needed for severe pain. Patient not taking: Reported on 07/29/2016 03/20/16   Sherren Mocha, MD    Allergies  Allergen Reactions  . Hydrocodone Nausea Only    nausea, stomach burning    There are no active problems to display for this patient.   Past Medical History:  Diagnosis Date  . Depression   . Right shoulder injury 09/26/2012  . Right wrist injury 09/26/2012    No past surgical history on file.  Social History   Social History  . Marital status: Married    Spouse name: Vernona Rieger  . Number of children: 2  . Years of education: 12th grade   Occupational History  . Holiday representative    Social History Main Topics  . Smoking status: Never Smoker  . Smokeless tobacco: Never Used  . Alcohol use No  . Drug use: No  . Sexual activity: No   Other Topics Concern  . Not on file   Social History Narrative   ** Merged History Encounter **       ** Merged History Encounter **       Lives with his wife and two daughters.   Originally from [place], [place]. Came to the [place] 1999.    Family History  Problem Relation Age of Onset  . Sudden death Neg Hx   . Hypertension Neg Hx   . Heart attack Neg Hx   . Hyperlipidemia Neg Hx   . Diabetes Neg Hx      Review of Systems  Constitutional: Negative for chills and fever.  HENT: Negative for ear  discharge, ear pain, sinus pain and sore throat.   Eyes: Negative for pain.  Respiratory: Negative for cough and shortness of breath.   Cardiovascular: Negative for chest pain and palpitations.  Gastrointestinal: Negative for nausea and vomiting.  Skin: Negative for rash.  Neurological: Positive for headaches. Negative for dizziness, sensory change and focal weakness.  All other systems reviewed and are negative.  Vitals:   07/29/16 1559  BP: (!) 163/90  Pulse: 70  Resp: 16  Temp: 98.3 F (36.8 C)     Physical Exam  Constitutional: He is oriented to person, place, and time. He appears well-developed and well-nourished.  HENT:  Head: Normocephalic and atraumatic.  +Fx left upper anterior molar  Eyes: EOM are normal. Pupils are equal, round, and reactive to light.  Neck: Normal range of motion. Neck supple.  Cardiovascular: Normal rate and regular rhythm.   Pulmonary/Chest: [place] normal.  Musculoskeletal: Normal range of motion.  Neurological: He is alert and oriented to person, place, and time. No sensory deficit. He exhibits normal muscle tone.  Skin: Skin is warm and dry. Capillary refill takes less than 2 seconds.  Psychiatric: He has a normal mood and affect. His behavior is normal.  Vitals reviewed.    ASSESSMENT & PLAN: Heaton was seen today for dental pain.  Diagnoses and all orders for this visit:  Toothache -     Ambulatory referral to Dentistry  Closed fracture of tooth, initial encounter -     Ambulatory referral to Dentistry  Other orders -     traMADol (ULTRAM) 50 MG tablet; Take 1 tablet (50 mg total) by mouth every 8 (eight) hours as needed. -     oxyCODONE-acetaminophen (ROXICET) 5-325 MG tablet; Take 1 tablet by mouth every 8 (eight) hours as needed for severe pain.    Patient Instructions       IF you received an x-ray today, you will receive an invoice from [facility]. Please contact [facility] at [phone] with  questions or concerns regarding your invoice.   IF you received labwork today, you will receive an invoice from [place]. Please contact [facility] at [phone] with questions or concerns regarding your invoice.   Our billing staff will not be able to assist you with questions regarding bills from these companies.  You will be contacted with the lab results as soon as they are available. The fastest way to get your results is to activate your My Chart account. Instructions are located on the last page of this paperwork. If you have not heard from [place] regarding the results in 2 weeks, please contact this office.     Dolor dental (Dental Pain) Las causas del dolor dental pueden ser muchas, entre ellas, las siguientes:  Caries. La caries hace que el nervio del diente est expuesto al aire y a las temperaturas calientes o fras. Esto puede causar dolor o molestias.  Infeccin o absceso. Un absceso dental es un rea que est llena de pus infectado debido a una infeccin bacteriana en la parte interna del diente (pulpa). Por lo general, se produce en el extremo de la raz del diente.  Lesiones.  Una razn desconocida (idioptica). El dolor puede ser leve o intenso. Quizs solo aparezca en las siguientes ocasiones:  Al masticar.  Al estar expuesto a una temperatura caliente o fra.  Al comer alimentos o tomar bebidas con azcar, por ejemplo, los siguientes:  Gaseosa.  Caramelos. El dolor tambin puede ser [place]. CUIDADOS EN EL HOGAR Controle el dolor dental a fin de Public house manager cambio. Para aliviar las [place], haga lo siguiente:  Tome los medicamentos solamente como se lo haya indicado el dentista.  Si el dentista le indic que tome antibiticos, debe terminarlos, incluso si comienza a Actor.  Concurra a todas las visitas de control como se lo haya indicado el dentista. Esto es importante.  No se aplique calor en la parte externa de la cara.  Si el dentista se  lo ha indicado, enjuguese la boca o haga grgaras con agua salada. Esto ayuda a Engineer, materials y Building services engineer.  Puede preparar agua salada agregando  de cucharadita de sal en 1taza de agua templada.  Aplquese hielo sobre la zona dolorida de la cara:  Ponga el hielo en una bolsa plstica.  Coloque una toalla entre la piel y la bolsa de hielo.  Coloque el hielo durante , 2 a 3veces por Futures trader.  Evite los alimentos o las bebidas que le causen [place], como los siguientes:  [place] o bebidas muy calientes o muy fros.  Alimentos o bebidas dulces o con azcar. SOLICITE AYUDA SI:  El dolor no se alivia con los [employer].  Los sntomas empeoran.  Aparecen  nuevos sntomas. SOLICITE AYUDA DE INMEDIATO SI:  No puede abrir la boca.  Tiene problemas para respirar o tragar.  Tiene fiebre.  Tiene hinchazn en la cara, el cuello o la mandbula. Esta informacin no tiene Theme park manager el consejo del mdico. Asegrese de hacerle al mdico cualquier pregunta que tenga. Document Released: 07/09/2008 Document Revised: 08/27/2014 Document Reviewed: 04/08/2014 Elsevier Interactive Patient Education  2017 Elsevier Inc.      Edwina Barth, MD [facility]

## 2016-08-04 ENCOUNTER — Other Ambulatory Visit: Payer: Self-pay | Admitting: Emergency Medicine

## 2016-08-04 MED ORDER — TRAMADOL HCL 50 MG PO TABS: 50.0000 mg | ORAL_TABLET | Freq: Three times a day (TID) | ORAL | 0 refills | Status: AC | PRN

## 2016-08-04 NOTE — Telephone Encounter (Signed)
Done and prescription for Tramadol printed and handed to [facility].

## 2016-08-04 NOTE — Telephone Encounter (Signed)
Would you like to switch?

## 2016-08-04 NOTE — Telephone Encounter (Signed)
Pt called explaining that the oxycodone prescribed to him is making him feel nauseous & makes him feel like he wants to switch to the tramadol because it doesn't give him the same feeling as the oxy. I told him that I would find out from Dr.Miguel to see if we can get this switched asap.   Please Advise.

## 2016-08-13 ENCOUNTER — Telehealth: Payer: Self-pay | Admitting: Emergency Medicine

## 2016-08-13 NOTE — Telephone Encounter (Signed)
08/04/16 last refill

## 2016-08-13 NOTE — Telephone Encounter (Signed)
Pt is calling to request a refill of his Tramodol until he can see the dentist next week.  He is in a lot of pain and the Tramodol worked best for him.  [phone]

## 2016-08-16 NOTE — Telephone Encounter (Signed)
PT CALLING FOR A REFILL ON TRAMADOL THE DOCTOR STATES THAT ITS UP FRONT WITH GEO KAREN DOESN'T SEE IT IN BOX PT ON HIS WAY UP HERE TO PICK UP RX

## 2016-08-17 ENCOUNTER — Telehealth: Payer: Self-pay | Admitting: General Practice

## 2016-08-17 NOTE — Telephone Encounter (Signed)
Pt is needing a refill on his tramadol   Best number [phone]

## 2016-08-18 NOTE — Telephone Encounter (Signed)
Called to [facility] 

## 2016-08-18 NOTE — Telephone Encounter (Signed)
dentisit appt is next week, asking for tramadol refill one more time, last refill 08/04/16

## 2016-08-18 NOTE — Telephone Encounter (Signed)
Not appropriate

## 2016-08-18 NOTE — Telephone Encounter (Signed)
rx denied, pt advised no tramadol get into the dentist Use ibuprofen or tylenol

## 2016-08-19 NOTE — Telephone Encounter (Signed)
Pt's daughter calling in. States that fathers apt is 5/3 and he was hoping for tramadol to hold him over until that apt. I relayed previous msgs but she did request that I ask again.

## 2016-08-19 NOTE — Telephone Encounter (Signed)
Refused by dr sagardia x 2

## 2016-08-20 NOTE — Telephone Encounter (Signed)
Daughter advised.

## 2016-08-23 ENCOUNTER — Emergency Department (HOSPITAL_COMMUNITY): Payer: Managed Care, Other (non HMO)

## 2016-08-23 ENCOUNTER — Emergency Department (HOSPITAL_COMMUNITY)
Admission: EM | Admit: 2016-08-23 | Discharge: 2016-08-24 | Disposition: A | Discharge status: Home / Self Care | Payer: Managed Care, Other (non HMO) | Attending: Emergency Medicine | Admitting: Emergency Medicine

## 2016-08-23 ENCOUNTER — Encounter (HOSPITAL_COMMUNITY): Payer: Self-pay

## 2016-08-23 ENCOUNTER — Other Ambulatory Visit: Payer: Self-pay

## 2016-08-23 DIAGNOSIS — R079 Chest pain, unspecified: Secondary | ICD-10-CM | POA: Diagnosis present

## 2016-08-23 DIAGNOSIS — Z79899 Other long term (current) drug therapy: Secondary | ICD-10-CM | POA: Diagnosis not present

## 2016-08-23 DIAGNOSIS — I10 Essential (primary) hypertension: Secondary | ICD-10-CM | POA: Diagnosis not present

## 2016-08-23 DIAGNOSIS — R000 Tachycardia, unspecified: Secondary | ICD-10-CM | POA: Diagnosis not present

## 2016-08-23 DIAGNOSIS — R091 Pleurisy: Secondary | ICD-10-CM | POA: Diagnosis not present

## 2016-08-23 HISTORY — DX: Essential (primary) hypertension: I10

## 2016-08-23 LAB — I-STAT TROPONIN, ED: Troponin i, poc: 0 ng/mL (ref 0.00–0.08)

## 2016-08-23 LAB — CBC
HCT: 44.4 % (ref 39.0–52.0)
Hemoglobin: 16 g/dL (ref 13.0–17.0)
MCH: 29.7 pg (ref 26.0–34.0)
MCHC: 36 g/dL (ref 30.0–36.0)
MCV: 82.4 fL (ref 78.0–100.0)
PLATELETS: 274 10*3/uL (ref 150–400)
RBC: 5.39 MIL/uL (ref 4.22–5.81)
RDW: 12.5 % (ref 11.5–15.5)
WBC: 13.1 10*3/uL — AB (ref 4.0–10.5)

## 2016-08-23 MED ORDER — SODIUM CHLORIDE 0.9 % IV BOLUS (SEPSIS)
1000.0000 mL | Freq: Once | INTRAVENOUS | Status: AC
  Administered 2016-08-23: 1000 mL via INTRAVENOUS

## 2016-08-23 MED ORDER — FENTANYL CITRATE (PF) 100 MCG/2ML IJ SOLN
50.0000 ug | Freq: Once | INTRAMUSCULAR | Status: AC
  Administered 2016-08-23: 50 ug via INTRAVENOUS
  Filled 2016-08-23: qty 2

## 2016-08-23 MED ORDER — LORAZEPAM 2 MG/ML IJ SOLN
1.0000 mg | Freq: Once | INTRAMUSCULAR | Status: AC
  Administered 2016-08-23: 1 mg via INTRAVENOUS
  Filled 2016-08-23: qty 1

## 2016-08-23 NOTE — ED Triage Notes (Signed)
Pt arrives via EMS with complaints of chest pain since 2200. Per EMS pt hr ranged from 120-140 during transport. PT reports pain as central chest tightness with no radiation. PR endorses nausea but denies vomiting or SOB. PTA ems administered 2 NTG,  zofran, 324 ASA.   #22 LH  Initially pt bp was 200/120 but decreased to 158/80 after 2 NTG.   Pt diaphoretic and appears anxious on arrival to ED.

## 2016-08-23 NOTE — ED Notes (Signed)
Portable Chest xary at bedside

## 2016-08-23 NOTE — ED Provider Notes (Signed)
[facility]EMER[facility]ENCY DEPT Provider Note   CSN: [id number] Arrival date & time: 08/23/16  2238  By signing my name below, I, Teofilo Pod, attest that this documentation has been prepared under the direction and in the presence of Zadie Rhine, MD . Electronically Signed: Teofilo Pod, ED Scribe. 08/23/2016. 11:07 PM.    History   Chief Complaint Chief Complaint  Patient presents with  . Chest Pain   LEVEL 5 CAVEAT DUE TO ACUITY OF CONDITION  The history is provided by the patient. The history is limited by a language barrier and the condition of the patient. No language interpreter was used.  Chest Pain   This is a new problem. The current episode started 1 to 2 hours ago. The problem occurs constantly. The problem has not changed since onset.He has tried nothing for the symptoms.  His past medical history is significant for hypertension.  Pertinent negatives for past medical history include no MI and no strokes.  HPI Comments:  Bruce Middleton is a 41 y.o. male with PMHx of HTN who presents to the Emergency Department via EMS complaining of constant chest pain that began 1 hour ago. Wife reports that after pt took a shower 1 hour ago, he began having chest pain and he vomited x 1. She states that pt drank 2 Red Bulls this evening prior to onset. Denies hx of MI, strokes, and he does not take anticoagulates. He is a smoker. No alleviating factors noted. Pt denies other associated symptoms.   Past Medical History:  Diagnosis Date  . Depression   . Hypertension   . Right shoulder injury 09/26/2012  . Right wrist injury 09/26/2012    Patient Active Problem List   Diagnosis Date Noted  . Toothache 07/29/2016  . Closed fracture of tooth 07/29/2016    History reviewed. No pertinent surgical history.     Home Medications    Prior to Admission medications   Medication Sig Start Date End Date Taking? Authorizing Provider  acetaminophen (TYLENOL) 325 MG tablet Take  650 mg by mouth every 6 (six) hours as needed.    Historical Provider, MD  oxyCODONE-acetaminophen (ROXICET) 5-325 MG tablet Take 1 tablet by mouth every 8 (eight) hours as needed for severe pain. 07/29/16   Miguel Victorino December, MD  traMADol (ULTRAM) 50 MG tablet Take 1 tablet (50 mg total) by mouth every 8 (eight) hours as needed. 08/04/16   Georgina Quint, MD    Family History Family History  Problem Relation Age of Onset  . Sudden death Neg Hx   . Hypertension Neg Hx   . Heart attack Neg Hx   . Hyperlipidemia Neg Hx   . Diabetes Neg Hx     Social History Social History  Substance Use Topics  . Smoking status: Never Smoker  . Smokeless tobacco: Never Used  . Alcohol use No     Allergies   Hydrocodone   Review of Systems Review of Systems  Unable to perform ROS: Acuity of condition     Physical Exam Updated Vital Signs BP (!) 164/80 (BP Location: Left Arm)   Pulse (!) 133   Temp 98.3 F (36.8 C) (Oral)   Resp 20   Ht 6' (1.829 m)   Wt 275 lb (124.7 kg)   SpO2 100%   BMI 37.30 kg/m   Physical Exam CONSTITUTIONAL: Anxious and ill appearing. diaphoresis HEAD: Normocephalic/atraumatic EYES: EOMI/PERRL ENMT: Mucous membranes moist NECK: supple no meningeal signs SPINE/BACK:entire spine nontender CV:  S1/S2 noted, no murmurs/rubs/gallops noted; tachycardic LUNGS: Lungs are clear to auscultation bilaterally, no apparent distress ABDOMEN: soft, nontender, no rebound or guarding, bowel sounds noted throughout abdomen GU:no cva tenderness NEURO: Pt is awake/alert/appropriate, moves all extremitiesx4.  No facial droop.   EXTREMITIES: pulses normal/equal x4, full ROM, no calf tenderness or edema  SKIN: warm, color normal, diaphoresis PSYCH: anxious   ED Treatments / Results  DIAGNOSTIC STUDIES:  Oxygen Saturation is 100% on RA, normal by my interpretation.    COORDINATION OF CARE:  11:04 PM Discussed treatment plan with pt at bedside and pt agreed to  plan.   Labs (all labs ordered are listed, but only abnormal results are displayed) Labs Reviewed  CBC - Abnormal; Notable for the following:       Result Value   WBC 13.1 (*)    All other components within normal limits  BASIC METABOLIC PANEL - Abnormal; Notable for the following:    Glucose, Bld 159 (*)    All other components within normal limits  BASIC METABOLIC PANEL - Abnormal; Notable for the following:    Glucose, Bld 139 (*)    All other components within normal limits  CBC - Abnormal; Notable for the following:    WBC 14.8 (*)    All other components within normal limits  D-DIMER, QUANTITATIVE (NOT AT [facility])  I-STAT TROPOININ, ED  I-STAT TROPOININ, ED    EKG  EKG Interpretation  Date/Time:  Monday August 23 2016 21:40:11 EDT Ventricular Rate:  129 PR Interval:  134 QRS Duration: 86 QT Interval:  314 QTC Calculation: 460 R Axis:   31 Text Interpretation:  Sinus tachycardia Possible Left atrial enlargement Borderline ECG No previous ECGs available Confirmed by Bebe Shaggy  MD, Staley Lunz ([id number]) on 08/23/2016 11:09:33 PM       Radiology Dg Chest Port 1 View  Result Date: 08/23/2016 CLINICAL DATA:  Chest pain EXAM: PORTABLE CHEST 1 VIEW COMPARISON:  None. FINDINGS: Mildly low lung volumes with mild basilar atelectasis. No focal consolidation or effusion. Heart size upper normal. No pneumothorax. IMPRESSION: Low lung volumes with mild basilar atelectasis. No acute infiltrate or edema Electronically Signed   By: Jasmine Pang M.D.   On: 08/23/2016 23:26    Procedures Procedures (including critical care time)  Medications Ordered in ED Medications  fentaNYL (SUBLIMAZE) injection 50 mcg (50 mcg Intravenous Given 08/23/16 2313)  LORazepam (ATIVAN) injection 1 mg (1 mg Intravenous Given 08/23/16 2358)  sodium chloride 0.9 % bolus 1,000 mL (0 mLs Intravenous Stopped 08/24/16 0136)  ketorolac (TORADOL) 30 MG/ML injection 15 mg (15 mg Intravenous Given 08/24/16 0134)  sodium  chloride 0.9 % bolus 1,000 mL (1,000 mLs Intravenous New Bag/Given 08/24/16 0134)  sodium chloride 0.9 % bolus 1,000 mL (1,000 mLs Intravenous New Bag/Given 08/24/16 0210)     Initial Impression / Assessment and Plan / ED Course  I have reviewed the triage vital signs and the nursing notes.  Pertinent labs & imaging results that were available during my care of the patient were reviewed by me and considered in my medical decision making (see chart for details).    1:46 AM  Pt improved.  HR improved, less anxious Interpreter - [phone] utilized for this conversation At 830pm CP and abdominal pain started.  It is chest pressure with shortness of breath No meds for DM  He has HTN fam hx CAD  = none HEART score  = 3 He reports drank 2 red bull today which could have contributed  to his symptoms 3:05 AM Pt improved HR improved to 110 No distress Chest wall tender to palpation ACS rule out complete with HEART score - 3 D-dimer negative I doubt ACS/PE/Dissection/myocarditis Suspect some of his symptoms due to red bull/caffeine   Seen recently by [facility] - started on HCTZ and lotensin, pt has lost these meds, this was re-prescribed  Will d/c home Discussed return precautions   Final Clinical Impressions(s) / ED Diagnoses   Final diagnoses:  Pleurisy  Sinus tachycardia    New Prescriptions New Prescriptions   BENAZEPRIL (LOTENSIN) 10 MG TABLET    Take 1 tablet (10 mg total) by mouth at bedtime.   HYDROCHLOROTHIAZIDE (HYDRODIURIL) 25 MG TABLET    Take 1 tablet (25 mg total) by mouth daily.  I personally performed the services described in this documentation, which was scribed in my presence. The recorded information has been reviewed and is accurate.        Zadie Rhine, MD 08/24/16 [phone]

## 2016-08-24 ENCOUNTER — Encounter (HOSPITAL_COMMUNITY): Payer: Self-pay | Admitting: Emergency Medicine

## 2016-08-24 ENCOUNTER — Emergency Department (HOSPITAL_COMMUNITY)
Admission: EM | Admit: 2016-08-24 | Discharge: 2016-08-24 | Disposition: A | Discharge status: Home / Self Care | Payer: Managed Care, Other (non HMO) | Source: Home / Self Care | Attending: Emergency Medicine | Admitting: Emergency Medicine

## 2016-08-24 ENCOUNTER — Emergency Department (HOSPITAL_COMMUNITY): Payer: Managed Care, Other (non HMO)

## 2016-08-24 DIAGNOSIS — I10 Essential (primary) hypertension: Secondary | ICD-10-CM

## 2016-08-24 DIAGNOSIS — Z79899 Other long term (current) drug therapy: Secondary | ICD-10-CM

## 2016-08-24 DIAGNOSIS — R0789 Other chest pain: Secondary | ICD-10-CM

## 2016-08-24 LAB — CBC
HCT: 44.3 % (ref 39.0–52.0)
HEMATOCRIT: 47.7 % (ref 39.0–52.0)
HEMOGLOBIN: 15.8 g/dL (ref 13.0–17.0)
HEMOGLOBIN: 17.3 g/dL — AB (ref 13.0–17.0)
MCH: 29.7 pg (ref 26.0–34.0)
MCH: 30.2 pg (ref 26.0–34.0)
MCHC: 35.7 g/dL (ref 30.0–36.0)
MCHC: 36.3 g/dL — ABNORMAL HIGH (ref 30.0–36.0)
MCV: 83.3 fL (ref 78.0–100.0)
MCV: 83.4 fL (ref 78.0–100.0)
Platelets: 196 10*3/uL (ref 150–400)
Platelets: 208 10*3/uL (ref 150–400)
RBC: 5.32 MIL/uL (ref 4.22–5.81)
RBC: 5.72 MIL/uL (ref 4.22–5.81)
RDW: 12.3 % (ref 11.5–15.5)
RDW: 12.5 % (ref 11.5–15.5)
WBC: 10 10*3/uL (ref 4.0–10.5)
WBC: 14.8 10*3/uL — AB (ref 4.0–10.5)

## 2016-08-24 LAB — BASIC METABOLIC PANEL
ANION GAP: 11 (ref 5–15)
ANION GAP: 9 (ref 5–15)
ANION GAP: 9 (ref 5–15)
BUN: 13 mg/dL (ref 6–20)
BUN: 14 mg/dL (ref 6–20)
BUN: 11 mg/dL (ref 6–20)
CALCIUM: 9 mg/dL (ref 8.9–10.3)
CALCIUM: 9 mg/dL (ref 8.9–10.3)
CO2: 24 mmol/L (ref 22–32)
CO2: 24 mmol/L (ref 22–32)
CO2: 21 mmol/L — ABNORMAL LOW (ref 22–32)
Calcium: 9.8 mg/dL (ref 8.9–10.3)
Chloride: 104 mmol/L (ref 101–111)
Chloride: 104 mmol/L (ref 101–111)
Chloride: 105 mmol/L (ref 101–111)
Creatinine, Ser: 1.05 mg/dL (ref 0.61–1.24)
Creatinine, Ser: 1.09 mg/dL (ref 0.61–1.24)
Creatinine, Ser: 1.04 mg/dL (ref 0.61–1.24)
GFR calc non Af Amer: >60 mL/min (ref >60)
GLUCOSE: 139 mg/dL — AB (ref 65–99)
GLUCOSE: 142 mg/dL — AB (ref 65–99)
Glucose, Bld: 159 mg/dL — ABNORMAL HIGH (ref 65–99)
POTASSIUM: 3.6 mmol/L (ref 3.5–5.1)
POTASSIUM: 4.1 mmol/L (ref 3.5–5.1)
Potassium: 3.7 mmol/L (ref 3.5–5.1)
SODIUM: 137 mmol/L (ref 135–145)
SODIUM: 137 mmol/L (ref 135–145)
Sodium: 137 mmol/L (ref 135–145)

## 2016-08-24 LAB — D-DIMER, QUANTITATIVE (NOT AT ARMC): D DIMER QUANT: 0.42 ug{FEU}/mL (ref 0.00–0.50)

## 2016-08-24 LAB — I-STAT TROPONIN, ED
TROPONIN I, POC: 0.01 ng/mL (ref 0.00–0.08)
TROPONIN I, POC: 0 ng/mL (ref 0.00–0.08)

## 2016-08-24 MED ORDER — LORAZEPAM 2 MG/ML IJ SOLN
2.0000 mg | Freq: Once | INTRAMUSCULAR | Status: AC
  Administered 2016-08-24: 2 mg via INTRAVENOUS
  Filled 2016-08-24: qty 1

## 2016-08-24 MED ORDER — SODIUM CHLORIDE 0.9 % IV BOLUS (SEPSIS)
1000.0000 mL | Freq: Once | INTRAVENOUS | Status: AC
  Administered 2016-08-24: 1000 mL via INTRAVENOUS

## 2016-08-24 MED ORDER — IBUPROFEN 400 MG PO TABS
600.0000 mg | ORAL_TABLET | Freq: Once | ORAL | Status: AC
  Administered 2016-08-24: 600 mg via ORAL
  Filled 2016-08-24: qty 1

## 2016-08-24 MED ORDER — HYDROCHLOROTHIAZIDE 25 MG PO TABS: 25.0000 mg | ORAL_TABLET | Freq: Every day | ORAL | 0 refills | Status: AC

## 2016-08-24 MED ORDER — BENAZEPRIL HCL 10 MG PO TABS: 10.0000 mg | ORAL_TABLET | Freq: Every day | ORAL | 0 refills | Status: AC

## 2016-08-24 MED ORDER — KETOROLAC TROMETHAMINE 30 MG/ML IJ SOLN
15.0000 mg | Freq: Once | INTRAMUSCULAR | Status: AC
  Administered 2016-08-24: 15 mg via INTRAVENOUS
  Filled 2016-08-24: qty 1

## 2016-08-24 NOTE — ED Provider Notes (Signed)
[facility] Provider Note   CSN: [id number] Arrival date & time: 08/24/16  0801     History   Chief Complaint Chief Complaint  Patient presents with  . Chest Pain    HPI Bruce Middleton is a 41 y.o. male.  HPI   41 year old male presents today with complaints of chest pain.  Patient was seen last night here in the ED with chest pain that began approximately 1 hour prior to his arrival.  Patient's story has changed noting that in addition to the 20 on treadmill that he was drinking yesterday patient had also snorted methamphetamine.  He notes that he was having a toothache and a coworker gave him a rock that he was told was methamphetamine.  He notes after snorting and he drank 20 ounces of red bull and had onset of the symptoms.  He describes it as left-sided chest pain, racing of heart, with pain into his left shoulder.  He notes feeling dizzy and lightheaded.  He was seen in the emergency room yesterday with improvement in his symptoms with a reassuring workup and was discharged home.  He notes after trying to go to bed he continued to have chest discomfort.  Patient denies any history of MI, he is a non-smoker, no diagnosis of hyperlipidemia.  Patient reports pain is worse with palpation of the chest wall.   Past Medical History:  Diagnosis Date  . Depression   . Hypertension   . Right shoulder injury 09/26/2012  . Right wrist injury 09/26/2012    Patient Active Problem List   Diagnosis Date Noted  . Toothache 07/29/2016  . Closed fracture of tooth 07/29/2016    History reviewed. No pertinent surgical history.   Home Medications    Prior to Admission medications   Medication Sig Start Date End Date Taking? Authorizing Provider  benazepril (LOTENSIN) 10 MG tablet Take 1 tablet (10 mg total) by mouth at bedtime. Patient taking differently: Take 20 mg by mouth at bedtime.  08/24/16  Yes Zadie Rhine, MD  hydrochlorothiazide (HYDRODIURIL) 25 MG tablet Take 1  tablet (25 mg total) by mouth daily. 08/24/16  Yes Zadie Rhine, MD  ibuprofen (ADVIL,MOTRIN) 200 MG tablet Take 200 mg by mouth every 6 (six) hours as needed for headache or moderate pain.   Yes Historical Provider, MD  oxyCODONE-acetaminophen (ROXICET) 5-325 MG tablet Take 1 tablet by mouth every 8 (eight) hours as needed for severe pain. Patient not taking: Reported on 08/24/2016 07/29/16   Georgina Quint, MD  traMADol (ULTRAM) 50 MG tablet Take 1 tablet (50 mg total) by mouth every 8 (eight) hours as needed. Patient not taking: Reported on 08/24/2016 08/04/16   Georgina Quint, MD    Family History Family History  Problem Relation Age of Onset  . Sudden death Neg Hx   . Hypertension Neg Hx   . Heart attack Neg Hx   . Hyperlipidemia Neg Hx   . Diabetes Neg Hx     Social History Social History  Substance Use Topics  . Smoking status: Never Smoker  . Smokeless tobacco: Never Used  . Alcohol use No     Allergies   Hydrocodone   Review of Systems Review of Systems  All other systems reviewed and are negative.  Physical Exam Updated Vital Signs BP (!) 147/90   Pulse 81   Temp 97.7 F (36.5 C) (Oral)   Resp (!) 24   SpO2 100%     Physical Exam  Constitutional:  He is oriented to person, place, and time. He appears well-developed and well-nourished.  HENT:  Head: Normocephalic and atraumatic.  Eyes: Conjunctivae are normal. Pupils are equal, round, and reactive to light. Right eye exhibits no discharge. Left eye exhibits no discharge. No scleral icterus.  Neck: Normal range of motion. No JVD present. No tracheal deviation present.  Cardiovascular: Regular rhythm, normal heart sounds and intact distal pulses.  Exam reveals no gallop and no friction rub.   No murmur heard. Pulses equal bilateral   Pulmonary/Chest: Effort normal and breath sounds normal. No stridor. No respiratory distress. He has no wheezes. He has no rales. He exhibits no tenderness.  Minor TTP  of left anterior chest wall  Musculoskeletal: Normal range of motion. He exhibits no edema.  Neurological: He is alert and oriented to person, place, and time. Coordination normal.  Skin: Skin is warm.  Psychiatric: He has a normal mood and affect. His behavior is normal. Judgment and thought content normal.  Nursing note and vitals reviewed.  ED Treatments / Results  Labs (all labs ordered are listed, but only abnormal results are displayed) Labs Reviewed  BASIC METABOLIC PANEL - Abnormal; Notable for the following:       Result Value   CO2 21 (*)    Glucose, Bld 142 (*)    All other components within normal limits  CBC - Abnormal; Notable for the following:    Hemoglobin 17.3 (*)    MCHC 36.3 (*)    All other components within normal limits  I-STAT TROPOININ, ED    EKG  EKG Interpretation  Date/Time:  Tuesday Aug 24 2016 14:33:45 EDT Ventricular Rate:  93 PR Interval:  136 QRS Duration: 96 QT Interval:  354 QTC Calculation: 441 R Axis:   75 Text Interpretation:  Sinus rhythm RSR' in V1 or V2, right VCD or RVH Baseline wander in lead(s) V3 no STEMI Confirmed by Rush Landmark MD, CHRISTOPHER [phone]) on 08/24/2016 2:56:01 PM       Radiology Dg Chest 2 View  Result Date: 08/24/2016 CLINICAL DATA:  Chest pain, dizziness. EXAM: CHEST  2 VIEW COMPARISON:  08/23/2016 FINDINGS: Heart and mediastinal contours are within normal limits. No focal opacities or effusions. No acute bony abnormality. IMPRESSION: No active cardiopulmonary disease. Electronically Signed   By: Charlett Nose M.D.   On: 08/24/2016 10:26   Dg Chest Port 1 View  Result Date: 08/23/2016 CLINICAL DATA:  Chest pain EXAM: PORTABLE CHEST 1 VIEW COMPARISON:  None. FINDINGS: Mildly low lung volumes with mild basilar atelectasis. No focal consolidation or effusion. Heart size upper normal. No pneumothorax. IMPRESSION: Low lung volumes with mild basilar atelectasis. No acute infiltrate or edema Electronically Signed   By: Jasmine Pang M.D.   On: 08/23/2016 23:26    Procedures Procedures (including critical care time)  Medications Ordered in ED Medications  sodium chloride 0.9 % bolus 1,000 mL (0 mLs Intravenous Stopped 08/24/16 1030)  LORazepam (ATIVAN) injection 2 mg (2 mg Intravenous Given 08/24/16 0935)  sodium chloride 0.9 % bolus 1,000 mL (0 mLs Intravenous Stopped 08/24/16 1320)  ibuprofen (ADVIL,MOTRIN) tablet 600 mg (600 mg Oral Given 08/24/16 1201)     Initial Impression / Assessment and Plan / ED Course  I have reviewed the triage vital signs and the nursing notes.  Pertinent labs & imaging results that were available during my care of the patient were reviewed by me and considered in my medical decision making (see chart for details).  Final Clinical Impressions(s) / ED Diagnoses   Final diagnoses:  Atypical chest pain    Labs: I stat trop, BMP, CBC,   Imaging: DG chest 2 view   Therapeutics: Ativan  Assessment/Plan: 22 year-year-old male presents today with chest pain.  Patient was seen last night for the same.  Patient admitted to using meth intranasally last night followed by 20 ounces a red bull.  Patient's symptoms seem to be improved with Ativan here in the ED. patient continues to have very minor baseline chest discomfort.  Patient has had 3 troponins since his initial presentation last night all showing no significant findings.  He has no significant changes on his EKG.  No signs of ACS presently today.  Patient does not have any signs consistent with pulmonary embolism here, he has a negative d-dimer.  Patient's discomfort likely secondary to drug use.  He is well appearing, he will be discharged home with his daughter with primary care follow-up, strict return precautions.  He verbalized understanding and agreement to today's plan had no further questions or concerns at the time discharge   New Prescriptions New Prescriptions   No medications on file     Eyvonne Mechanic,  PA-C 08/24/16 1511    Canary Brim Tegeler, MD 08/25/16 [phone]

## 2016-08-24 NOTE — ED Triage Notes (Signed)
Pt reports being discharged early this morning, was seen last night for chest pain. States pain became worse when he got home and tried to go to sleep.

## 2016-08-24 NOTE — Discharge Instructions (Signed)
Please read attached information. If you experience any new or worsening signs or symptoms please return to the emergency room for evaluation. Please follow-up with your primary care provider or specialist as discussed. Please avoid using any stimulants.

## 2016-08-24 NOTE — ED Notes (Signed)
Patient transported to X-ray 

## 2016-08-24 NOTE — ED Notes (Signed)
Pt.place on monitor and gown

## 2018-01-02 IMAGING — DX DG CHEST 2V
2 series · 2 of 2 positions shown · non-contrast
Comparison: 08/23/2016

CLINICAL DATA: Chest pain, dizziness.

EXAM:
CHEST  2 VIEW

[w chest pa]
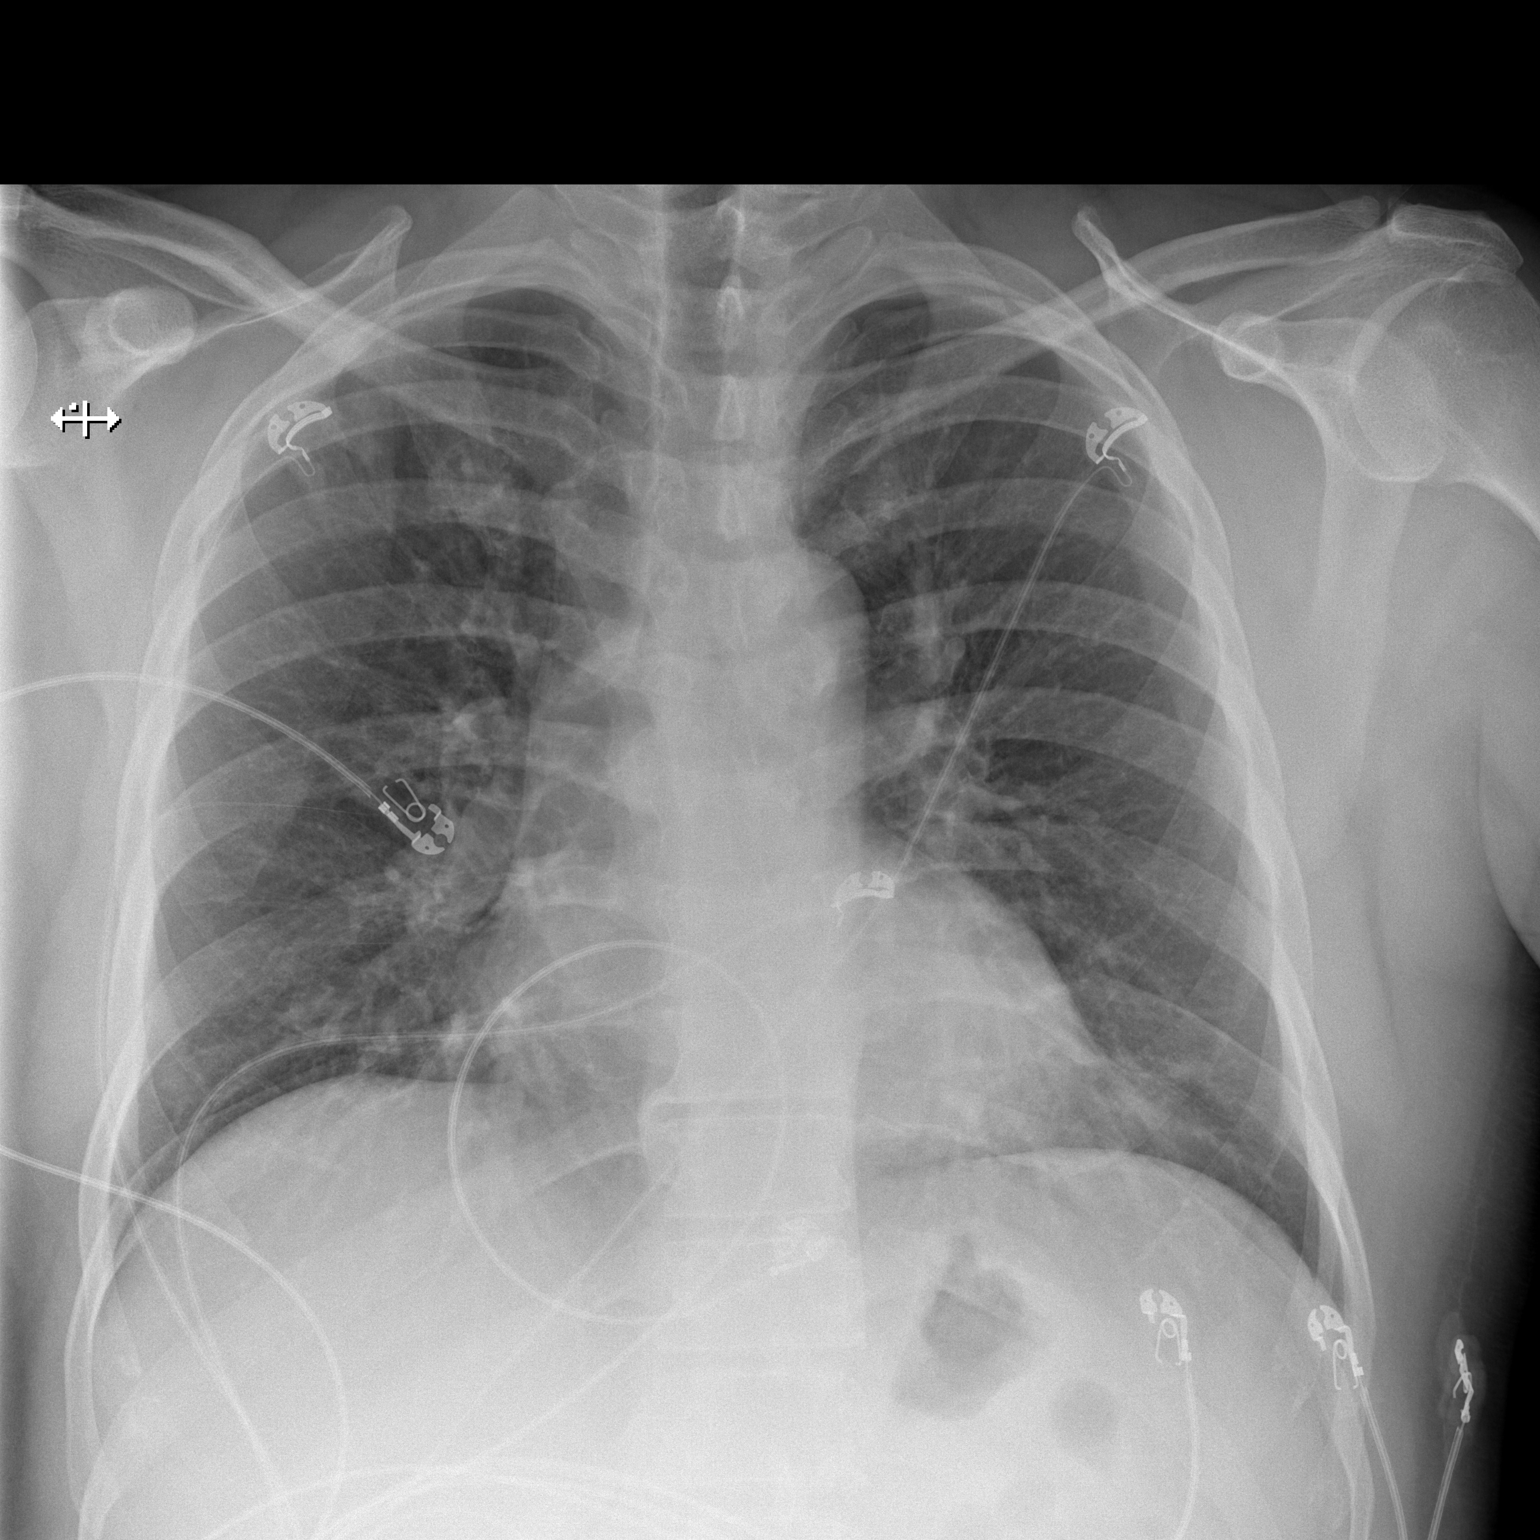

[w chest lat]
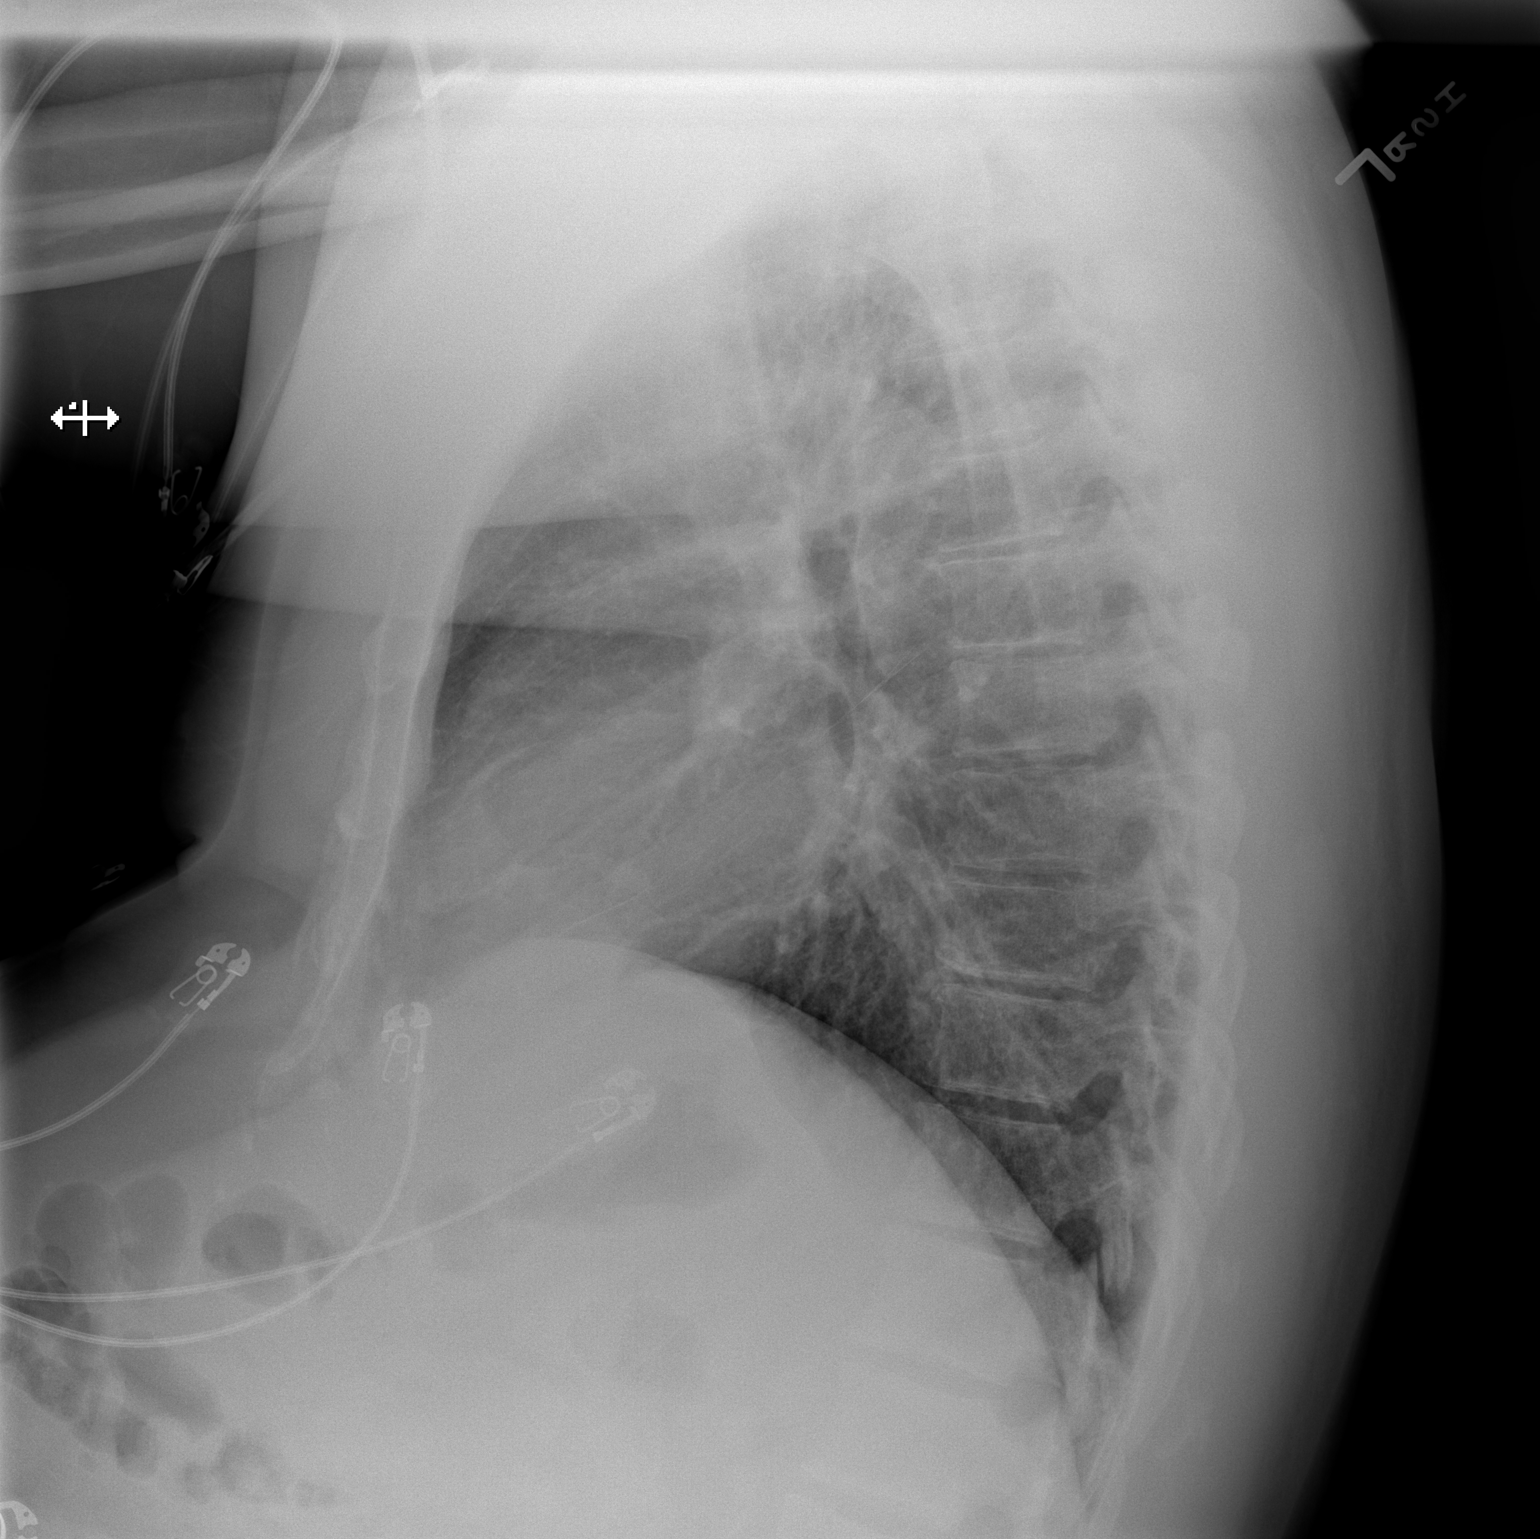

[2 of 2 positions shown; findings below may reference images not displayed]

FINDINGS: Heart and mediastinal contours are within normal limits. No focal
opacities or effusions. No acute bony abnormality.
IMPRESSION: No active cardiopulmonary disease.

## 2019-06-26 ENCOUNTER — Other Ambulatory Visit: Payer: Self-pay | Admitting: Physician Assistant

## 2019-06-26 DIAGNOSIS — R1032 Left lower quadrant pain: Secondary | ICD-10-CM

## 2019-06-27 ENCOUNTER — Other Ambulatory Visit: Payer: Managed Care, Other (non HMO)
